# Patient Record
Sex: Female | Born: 1960 | Race: Black or African American | Hispanic: No | Marital: Single | Smoking: Former smoker
Health system: Southern US, Community
[De-identification: ages and names within clinical notes are randomized; demographics above are authoritative.]

## PROBLEM LIST (undated history)

## (undated) DIAGNOSIS — E0590 Thyrotoxicosis, unspecified without thyrotoxic crisis or storm: Secondary | ICD-10-CM

## (undated) HISTORY — PX: ABDOMINAL HYSTERECTOMY: SHX81

## (undated) HISTORY — DX: Thyrotoxicosis, unspecified without thyrotoxic crisis or storm: E05.90

---

## 2015-01-13 LAB — TSH: TSH: 0.16 u[IU]/mL — AB (ref <=5.90)

## 2015-02-16 ENCOUNTER — Encounter: Payer: Self-pay | Admitting: "Endocrinology

## 2015-02-16 ENCOUNTER — Ambulatory Visit (INDEPENDENT_AMBULATORY_CARE_PROVIDER_SITE_OTHER): Payer: 59 | Admitting: "Endocrinology

## 2015-02-16 VITALS — BP 110/77 | HR 104 | Ht 61.0 in | Wt 134.0 lb

## 2015-02-16 DIAGNOSIS — E0590 Thyrotoxicosis, unspecified without thyrotoxic crisis or storm: Secondary | ICD-10-CM

## 2015-02-16 MED ORDER — PROPRANOLOL HCL 20 MG PO TABS
20.0000 mg | ORAL_TABLET | Freq: Three times a day (TID) | ORAL | Status: DC
Start: 2015-02-16 — End: 2015-02-24

## 2015-02-16 NOTE — Progress Notes (Signed)
Subjective:    Patient ID: Lorraine Gordon, female    DOB: 1960/10/04, PCP [facility].   History reviewed. No pertinent past medical history. Past Surgical History  Procedure Laterality Date  . Abdominal hysterectomy     Social History   Social History  . Marital Status: Single    Spouse Name: N/A  . Number of Children: N/A  . Years of Education: N/A   Social History Main Topics  . Smoking status: Former Games developer  . Smokeless tobacco: None  . Alcohol Use: No  . Drug Use: No  . Sexual Activity: Not Asked   Other Topics Concern  . None   Social History Narrative  . None   Outpatient Encounter Prescriptions as of 02/16/2015  Medication Sig  . Calcium Citrate-Vitamin D (CITRACAL MAXIMUM PO) Take by mouth daily.  . cyclobenzaprine (FLEXERIL) 10 MG tablet Take 10 mg by mouth 3 (three) times daily as needed for muscle spasms.  Marland Kitchen ibuprofen (ADVIL,MOTRIN) 800 MG tablet Take 800 mg by mouth every 8 (eight) hours as needed.  . Multiple Vitamins-Minerals (CENTRUM SILVER PO) Take by mouth daily.  . propranolol (INDERAL) 20 MG tablet Take 1 tablet (20 mg total) by mouth 3 (three) times daily.  . Pseudoephedrine-Ibuprofen (ADVIL COLD/SINUS PO) Take by mouth daily as needed.  . sodium chloride (OCEAN) 0.65 % SOLN nasal spray Place 1 spray into both nostrils.   No facility-administered encounter medications on file as of 02/16/2015.   ALLERGIES: Allergies  Allergen Reactions  . Compazine [Prochlorperazine Edisylate]   . Phenergan [Promethazine Hcl]    VACCINATION STATUS:  There is no immunization history on file for this patient.  HPI  The patient presents today with a medical history as above, and is being seen in consultation for hyperthyroidism requested by her primary care provider at [facility] .   The patient has been dealing with symptoms of  weight loss of 15 pounds in a year, sleep disturbance, and safety for Katrinka Blazing a  six-month. These symptoms are progressively worsening and troubling to patient.   The patient does not know family history of thyroid dysfunction  since she is adopted. 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  Patient is not on any anti-thyroid medications nor on any thyroid hormone supplements. Patient  is willing to proceed with appropriate work up and therapy for thyrotoxicosis.   Review of Systems Constitutional: +weight loss, +fatigue, no subjective hyperthermia/hypothermia Eyes: no blurry vision, no xerophthalmia ENT: no sore throat, no nodules palpated in throat, no dysphagia/odynophagia, no hoarseness Cardiovascular: no CP/SOB/palpitations/leg swelling Respiratory: no cough/SOB Gastrointestinal: no N/V/D/C Musculoskeletal: no muscle/joint aches Skin: no rashes Neurological: +tremors/numbness of outstretched hands. Psych : no depression/anxiety  Objective:    BP 110/77 mmHg  Pulse 104  Ht  (1.549 m)  Wt 134 lb (60.782 kg)  BMI 25.33 kg/m2  SpO2 98%  Wt Readings from Last 3 Encounters:  02/16/15 134 lb (60.782 kg)    Physical Exam Constitutional: overweight, in NAD Eyes: PERRLA, EOMI, no exophthalmos ENT: moist mucous membranes, palpable thyroid with no gross enlargement, no cervical lymphadenopathy Cardiovascular: Tachycardic, No MRG Respiratory: CTA B Gastrointestinal: abdomen soft, NT, ND, BS+ Musculoskeletal: no deformities, strength intact in all 4 Skin: moist, warm, no rashes Neurological: + tremor with outstretched hands, DTR normal in all 4  On December 15,016 TSH 0.16, free T3 3 .0, T3 uptake 23, total T4 10.6, free  T4 index 2.4   Assessment & Plan:   1. Subclinical hyperthyroidism Patient's history and most recent labs are reviewed. Findings are consistent with thyrotoxicosis likely from hyperthyroidism. The potential risks of untreated thyrotoxicosis and the need for definitive therapy have been  discussed in detail with the patient, and the patient agrees to proceed with plan.   I like to repeat full profile thyroid function tests today , if suggestive she will have  confirmatory thyroid uptake and scan.   Therapy may involve RAI ablation of the thyroid, with subsequent need for lifelong thyroid hormone replacement. Pt is made aware of this fact and willing to proceed. The patient will return in 1 week for reevaluation. I will initiate low dose Propranolol  20 mg by mouth twice a day for symptomatic relief.  - 40 minutes of time was spent on the care of this patient , 50% of which was applied for counseling on complications of thyrotoxicosis, options ,  and the need for definitive therapy to prevent these complications.  - I advised patient to maintain close follow up with [facility] for primary care needs.  Follow up plan: Return in about 1 week (around 02/23/2015) for overactive thyroid, labs today.  Marquis Lunch, MD Phone: [phone]  Fax: [phone]   02/16/2015, 8:01 PM

## 2015-02-17 LAB — TSH: TSH: 0.307 u[IU]/mL — ABNORMAL LOW (ref 0.350–4.500)

## 2015-02-17 LAB — THYROID PEROXIDASE ANTIBODY: Thyroperoxidase Ab SerPl-aCnc: 2 IU/mL (ref <9)

## 2015-02-17 LAB — T4, FREE: FREE T4: 1.05 ng/dL (ref 0.80–1.80)

## 2015-02-17 LAB — THYROGLOBULIN ANTIBODY: Thyroglobulin Ab: <1 IU/mL (ref <2)

## 2015-02-24 ENCOUNTER — Ambulatory Visit (INDEPENDENT_AMBULATORY_CARE_PROVIDER_SITE_OTHER): Payer: 59 | Admitting: "Endocrinology

## 2015-02-24 ENCOUNTER — Encounter: Payer: Self-pay | Admitting: "Endocrinology

## 2015-02-24 VITALS — BP 99/67 | HR 78 | Ht 61.0 in | Wt 135.0 lb

## 2015-02-24 DIAGNOSIS — E0590 Thyrotoxicosis, unspecified without thyrotoxic crisis or storm: Secondary | ICD-10-CM

## 2015-02-24 MED ORDER — PROPRANOLOL HCL 20 MG PO TABS: 20.0000 mg | ORAL_TABLET | Freq: Two times a day (BID) | ORAL | Status: DC

## 2015-02-24 MED ORDER — METHIMAZOLE 5 MG PO TABS: 5.0000 mg | ORAL_TABLET | Freq: Every day | ORAL | Status: DC

## 2015-02-24 NOTE — Progress Notes (Signed)
Subjective:    Patient ID: Lorraine Gordon, female    DOB: Dec 29, 1960, PCP [facility].   History reviewed. No pertinent past medical history. Past Surgical History  Procedure Laterality Date  . Abdominal hysterectomy     Social History   Social History  . Marital Status: Single    Spouse Name: N/A  . Number of Children: N/A  . Years of Education: N/A   Social History Main Topics  . Smoking status: Former Games developer  . Smokeless tobacco: None  . Alcohol Use: No  . Drug Use: No  . Sexual Activity: Not Asked   Other Topics Concern  . None   Social History Narrative   Outpatient Encounter Prescriptions as of 02/24/2015  Medication Sig  . Calcium Citrate-Vitamin D (CITRACAL MAXIMUM PO) Take by mouth daily.  . cyclobenzaprine (FLEXERIL) 10 MG tablet Take 10 mg by mouth 3 (three) times daily as needed for muscle spasms.  Marland Kitchen ibuprofen (ADVIL,MOTRIN) 800 MG tablet Take 800 mg by mouth every 8 (eight) hours as needed.  . Multiple Vitamins-Minerals (CENTRUM SILVER PO) Take by mouth daily.  . propranolol (INDERAL) 20 MG tablet Take 1 tablet (20 mg total) by mouth 2 (two) times daily.  . Pseudoephedrine-Ibuprofen (ADVIL COLD/SINUS PO) Take by mouth daily as needed.  . sodium chloride (OCEAN) 0.65 % SOLN nasal spray Place 1 spray into both nostrils.  . [DISCONTINUED] propranolol (INDERAL) 20 MG tablet Take 1 tablet (20 mg total) by mouth 3 (three) times daily.  . methimazole (TAPAZOLE) 5 MG tablet Take 1 tablet (5 mg total) by mouth daily.   No facility-administered encounter medications on file as of 02/24/2015.   ALLERGIES: Allergies  Allergen Reactions  . Compazine [Prochlorperazine Edisylate]   . Phenergan [Promethazine Hcl]    VACCINATION STATUS:  There is no immunization history on file for this patient.  HPI  The patient presents today with a medical history as above. She is here for follow-up after repeat thyroid function tests for  hyperthyroidism.    The patient has been dealing with symptoms of  weight loss of 15 pounds in a year, sleep disturbance, and safety for Katrinka Blazing a six-month. These symptoms are progressively worsening and troubling to patient.  -She was started on low-dose propranolol which helped with symptoms. The patient does not know family history of thyroid dysfunction  since she is adopted. 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  Patient is not on any anti-thyroid medications nor on any thyroid hormone supplements.   Review of Systems Constitutional: +weight loss, +fatigue, no subjective hyperthermia/hypothermia Eyes: no blurry vision, no xerophthalmia ENT: no sore throat, no nodules palpated in throat, no dysphagia/odynophagia, no hoarseness Cardiovascular: no CP/SOB/palpitations/leg swelling Respiratory: no cough/SOB Gastrointestinal: no N/V/D/C Musculoskeletal: no muscle/joint aches Skin: no rashes Neurological: +tremors/numbness of outstretched hands. Psych : no depression/anxiety  Objective:    BP 99/67 mmHg  Pulse 78  Ht  (1.549 m)  Wt 135 lb (61.236 kg)  BMI 25.52 kg/m2  SpO2 99%  Wt Readings from Last 3 Encounters:  02/24/15 135 lb (61.236 kg)  02/16/15 134 lb (60.782 kg)    Physical Exam Constitutional: overweight, in NAD Eyes: PERRLA, EOMI, no exophthalmos ENT: moist mucous membranes, palpable thyroid with no gross enlargement, no cervical lymphadenopathy Cardiovascular: Tachycardia resolved, No MRG Respiratory: CTA B Gastrointestinal: abdomen soft, NT, ND, BS+ Musculoskeletal: no deformities, strength intact in all 4 Skin: moist, warm, no rashes  Neurological: + tremor with outstretched hands, DTR normal in all 4  Recent Results (from the past 2160 hour(s))  TSH     Status: Abnormal   Collection Time: 01/13/15 12:00 AM  Result Value Ref Range   TSH 0.16 (A) .41 - 5.90 uIU/mL  Thyroid peroxidase antibody     Status:  None   Collection Time: 02/16/15  3:06 PM  Result Value Ref Range   Thyroperoxidase Ab SerPl-aCnc 2 <9 IU/mL  Thyroglobulin antibody     Status: None   Collection Time: 02/16/15  3:06 PM  Result Value Ref Range   Thyroglobulin Ab <1 <2 IU/mL  TSH     Status: Abnormal   Collection Time: 02/16/15  3:06 PM  Result Value Ref Range   TSH 0.307 (L) 0.350 - 4.500 uIU/mL  T4, free     Status: None   Collection Time: 02/16/15  3:06 PM  Result Value Ref Range   Free T4 1.05 0.80 - 1.80 ng/dL    Assessment & Plan:   1. Subclinical hyperthyroidism Her repeat labs show mild suppression of TSH to 0.3 associated with some nonspecific clinical symptoms. Given the fact that she has benefited from low-dose beta blocker, I will continue on propranolol 20 mg by mouth twice a day. I will also add methimazole 5 mg by mouth daily. She would not need radioactive iodine thyroid ablation for now. -I will repeat thyroid function tests in 8 weeks with office visit. - - I advised patient to maintain close follow up with [facility] for primary care needs.  Follow up plan: Return in about 8 weeks (around 04/21/2015) for overactive thyroid.  Marquis Lunch, MD Phone: [phone]  Fax: [phone]   02/24/2015, 3:27 PM

## 2015-04-12 ENCOUNTER — Other Ambulatory Visit: Payer: Self-pay | Admitting: "Endocrinology

## 2015-04-12 LAB — TSH: TSH: 0.73 m[IU]/L

## 2015-04-12 LAB — T4, FREE: FREE T4: 0.9 ng/dL (ref 0.8–1.8)

## 2015-04-15 ENCOUNTER — Other Ambulatory Visit: Payer: Self-pay | Admitting: "Endocrinology

## 2015-04-25 ENCOUNTER — Ambulatory Visit (INDEPENDENT_AMBULATORY_CARE_PROVIDER_SITE_OTHER): Payer: 59 | Admitting: "Endocrinology

## 2015-04-25 ENCOUNTER — Encounter: Payer: Self-pay | Admitting: "Endocrinology

## 2015-04-25 VITALS — BP 106/74 | HR 70 | Resp 18 | Ht 61.0 in | Wt 137.0 lb

## 2015-04-25 DIAGNOSIS — E0590 Thyrotoxicosis, unspecified without thyrotoxic crisis or storm: Secondary | ICD-10-CM | POA: Diagnosis not present

## 2015-04-25 MED ORDER — METHIMAZOLE 5 MG PO TABS: 2.5000 mg | ORAL_TABLET | Freq: Every day | ORAL | Status: DC

## 2015-04-25 MED ORDER — PROPRANOLOL HCL 20 MG PO TABS: 20.0000 mg | ORAL_TABLET | Freq: Two times a day (BID) | ORAL | Status: DC

## 2015-04-25 MED ORDER — PROPRANOLOL HCL 20 MG PO TABS: 20.0000 mg | ORAL_TABLET | Freq: Every day | ORAL | Status: DC

## 2015-04-25 NOTE — Progress Notes (Signed)
Subjective:    Patient ID: Lorraine Gordon, female    DOB: 11/07/1960, PCP [facility].   No past medical history on file. Past Surgical History  Procedure Laterality Date  . Abdominal hysterectomy     Social History   Social History  . Marital Status: Single    Spouse Name: N/A  . Number of Children: N/A  . Years of Education: N/A   Social History Main Topics  . Smoking status: Former Games developer  . Smokeless tobacco: None  . Alcohol Use: No  . Drug Use: No  . Sexual Activity: Not Asked   Other Topics Concern  . None   Social History Narrative   Outpatient Encounter Prescriptions as of 04/25/2015  Medication Sig  . Calcium Citrate-Vitamin D (CITRACAL MAXIMUM PO) Take by mouth daily.  . cyclobenzaprine (FLEXERIL) 10 MG tablet Take 10 mg by mouth 3 (three) times daily as needed for muscle spasms.  Marland Kitchen ibuprofen (ADVIL,MOTRIN) 800 MG tablet Take 800 mg by mouth every 8 (eight) hours as needed.  . methimazole (TAPAZOLE) 5 MG tablet Take 0.5 tablets (2.5 mg total) by mouth daily.  . Multiple Vitamins-Minerals (CENTRUM SILVER PO) Take by mouth daily.  . propranolol (INDERAL) 20 MG tablet Take 1 tablet (20 mg total) by mouth daily with breakfast.  . Pseudoephedrine-Ibuprofen (ADVIL COLD/SINUS PO) Take by mouth daily as needed.  . sodium chloride (OCEAN) 0.65 % SOLN nasal spray Place 1 spray into both nostrils.  . [DISCONTINUED] methimazole (TAPAZOLE) 5 MG tablet TAKE 1 TABLET BY MOUTH ONCE DAILY.  . [DISCONTINUED] methimazole (TAPAZOLE) 5 MG tablet Take 2.5 mg by mouth daily.  . [DISCONTINUED] propranolol (INDERAL) 20 MG tablet Take 1 tablet (20 mg total) by mouth 2 (two) times daily.  . [DISCONTINUED] propranolol (INDERAL) 20 MG tablet Take 20 mg by mouth daily with breakfast.  . [DISCONTINUED] propranolol (INDERAL) 20 MG tablet Take 1 tablet (20 mg total) by mouth 2 (two) times daily.   No facility-administered encounter medications on file as of  04/25/2015.   ALLERGIES: Allergies  Allergen Reactions  . Compazine [Prochlorperazine Edisylate]   . Phenergan [Promethazine Hcl]    VACCINATION STATUS:  There is no immunization history on file for this patient.  HPI -She is being treated for mild hyperthyroidism with Tapazole. Since last visit she continued to feel better. She has regained a few of her pounds.  -She denies new complaints. She has noticed improvement in her sleep , energy, and mental function.    -She was started on low-dose Tapazole and propranolol which helped with symptoms. The patient does not know family history of thyroid dysfunction  since she is adopted. 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    Review of Systems Constitutional: +weight gain, -fatigue, no subjective hyperthermia/hypothermia Eyes: no blurry vision, no xerophthalmia ENT: no sore throat, no nodules palpated in throat, no dysphagia/odynophagia, no hoarseness Cardiovascular: no CP/SOB/palpitations/leg swelling Respiratory: no cough/SOB Gastrointestinal: no N/V/D/C Musculoskeletal: no muscle/joint aches Skin: no rashes Neurological: tremors/numbness of outstretched hands. Psych : no depression/anxiety  Objective:    BP 106/74 mmHg  Pulse 70  Resp 18  Ht  (1.549 m)  Wt 137 lb (62.143 kg)  BMI 25.90 kg/m2  SpO2 98%  Wt Readings from Last 3 Encounters:  04/25/15 137 lb (62.143 kg)  02/24/15 135 lb (61.236 kg)  02/16/15 134 lb (60.782 kg)    Physical Exam Constitutional: overweight, in NAD  Eyes: PERRLA, EOMI, no exophthalmos ENT: moist mucous membranes, palpable thyroid with no gross enlargement, no cervical lymphadenopathy Cardiovascular: Tachycardia resolved, No MRG Respiratory: CTA B Gastrointestinal: abdomen soft, NT, ND, BS+ Musculoskeletal: no deformities, strength intact in all 4 Skin: moist, warm, no rashes Neurological: - no tremor with outstretched hands, DTR normal  in all 4  Recent Results (from the past 2160 hour(s))  Thyroid peroxidase antibody     Status: None   Collection Time: 02/16/15  3:06 PM  Result Value Ref Range   Thyroperoxidase Ab SerPl-aCnc 2 <9 IU/mL  Thyroglobulin antibody     Status: None   Collection Time: 02/16/15  3:06 PM  Result Value Ref Range   Thyroglobulin Ab <1 <2 IU/mL  TSH     Status: Abnormal   Collection Time: 02/16/15  3:06 PM  Result Value Ref Range   TSH 0.307 (L) 0.350 - 4.500 uIU/mL  T4, free     Status: None   Collection Time: 02/16/15  3:06 PM  Result Value Ref Range   Free T4 1.05 0.80 - 1.80 ng/dL  TSH     Status: None   Collection Time: 04/12/15  7:39 AM  Result Value Ref Range   TSH 0.73 mIU/L    Comment:   Reference Range   > or = 20 Years  0.40-4.50   Pregnancy Range First trimester  0.26-2.66 Second trimester 0.55-2.73 Third trimester  0.43-2.91     T4, free     Status: None   Collection Time: 04/12/15  7:39 AM  Result Value Ref Range   Free T4 0.9 0.8 - 1.8 ng/dL    Assessment & Plan:   1. Subclinical hyperthyroidism Her repeat labs show Treatment response. I advised her to lower the dose of Tapazole to 2.5 mg by mouth daily and propranolol to 20 mg only one time a day.   She will not need radioactive iodine thyroid ablation for now. -I will repeat thyroid function tests in 12 weeks with office visit.  - I advised patient to [facility].  Follow up plan: Return in about 3 months (around 07/26/2015) for overactive thyroid, follow up with pre-visit labs.  Marquis LunchGebre Avenir Lozinski, MD Phone: [phone]  Fax: [phone]   04/25/2015, 3:27 PM

## 2015-06-01 ENCOUNTER — Telehealth: Payer: Self-pay

## 2015-06-01 NOTE — Telephone Encounter (Signed)
Pt states that she had a BP reading of 90/55. She was concerned about taking the Propanolol and does the dosage need to be adjusted. She also Checked her BP the following day and it was 123/76. I advised her to keep track of if and to let us know what her BP readings are when Dr Fransico HimNida returns on 06/07/15. In the meantime I advised her to contact her PCP if she has any problem before then,.

## 2015-06-15 NOTE — Telephone Encounter (Signed)
Called pt to follow up. Left voice mail for her to call back w/ any concerns.

## 2015-07-27 ENCOUNTER — Ambulatory Visit: Payer: 59 | Admitting: "Endocrinology

## 2015-08-15 ENCOUNTER — Other Ambulatory Visit: Payer: Self-pay | Admitting: "Endocrinology

## 2015-08-16 ENCOUNTER — Other Ambulatory Visit: Payer: Self-pay | Admitting: "Endocrinology

## 2015-08-16 LAB — T4, FREE: Free T4: 0.8 ng/dL (ref 0.8–1.8)

## 2015-08-16 LAB — TSH: TSH: 0.76 mIU/L

## 2015-09-26 ENCOUNTER — Encounter: Payer: Self-pay | Admitting: "Endocrinology

## 2015-09-26 ENCOUNTER — Ambulatory Visit (INDEPENDENT_AMBULATORY_CARE_PROVIDER_SITE_OTHER): Payer: 59 | Admitting: "Endocrinology

## 2015-09-26 VITALS — BP 114/74 | HR 76 | Ht 61.0 in | Wt 135.0 lb

## 2015-09-26 DIAGNOSIS — E0590 Thyrotoxicosis, unspecified without thyrotoxic crisis or storm: Secondary | ICD-10-CM | POA: Diagnosis not present

## 2015-09-26 NOTE — Progress Notes (Signed)
Subjective:    Patient ID: Lorraine Gordon, female    DOB: 01-29-1961, PCP [facility].   History reviewed. No pertinent past medical history. Past Surgical History:  Procedure Laterality Date  . ABDOMINAL HYSTERECTOMY     Social History   Social History  . Marital status: Single    Spouse name: N/A  . Number of children: N/A  . Years of education: N/A   Social History Main Topics  . Smoking status: Former Games developer  . Smokeless tobacco: Never Used  . Alcohol use No  . Drug use: No  . Sexual activity: Not Asked   Other Topics Concern  . None   Social History Narrative  . None   Outpatient Encounter Prescriptions as of 09/26/2015  Medication Sig  . ferrous sulfate 325 (65 FE) MG EC tablet Take 325 mg by mouth 3 (three) times daily with meals.  . Calcium Citrate-Vitamin D (CITRACAL MAXIMUM PO) Take by mouth daily.  . cyclobenzaprine (FLEXERIL) 10 MG tablet Take 10 mg by mouth 3 (three) times daily as needed for muscle spasms.  Marland Kitchen ibuprofen (ADVIL,MOTRIN) 800 MG tablet Take 800 mg by mouth every 8 (eight) hours as needed.  . Multiple Vitamins-Minerals (CENTRUM SILVER PO) Take by mouth daily.  . Pseudoephedrine-Ibuprofen (ADVIL COLD/SINUS PO) Take by mouth daily as needed.  . sodium chloride (OCEAN) 0.65 % SOLN nasal spray Place 1 spray into both nostrils.  . [DISCONTINUED] methimazole (TAPAZOLE) 5 MG tablet TAKE 1/2 TABLET BY MOUTH ONCE DAILY  . [DISCONTINUED] propranolol (INDERAL) 20 MG tablet Take 1 tablet (20 mg total) by mouth daily with breakfast. (Patient taking differently: Take 10 mg by mouth daily with breakfast. )   No facility-administered encounter medications on file as of 09/26/2015.    ALLERGIES: Allergies  Allergen Reactions  . Compazine [Prochlorperazine Edisylate]   . Phenergan [Promethazine Hcl]    VACCINATION STATUS:  There is no immunization history on file for this patient.  HPI -She is being treated for mild  hyperthyroidism with Tapazole. Since last visit she continued to feel better. She has Steady body weight this time.  -She denies new complaints. She has noticed improvement in her sleep , energy, and mental function.    -She was started on low-dose Tapazole and propranolol which helped with symptoms. The patient does not know family history of thyroid dysfunction  since she is adopted. 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    Review of Systems Constitutional: - weight gain, -fatigue, no subjective hyperthermia/hypothermia Eyes: no blurry vision, no xerophthalmia ENT: no sore throat, no nodules palpated in throat, no dysphagia/odynophagia, no hoarseness Cardiovascular: no CP/SOB/palpitations/leg swelling Respiratory: no cough/SOB Gastrointestinal: no N/V/D/C Musculoskeletal: no muscle/joint aches Skin: no rashes Neurological: tremors/numbness of outstretched hands. Psych : no depression/anxiety  Objective:    BP 114/74   Pulse 76   Ht 5\' 1"  (1.549 m)   Wt 135 lb (61.2 kg)   BMI 25.51 kg/m   Wt Readings from Last 3 Encounters:  09/26/15 135 lb (61.2 kg)  04/25/15 137 lb (62.1 kg)  02/24/15 135 lb (61.2 kg)    Physical Exam Constitutional: overweight, in NAD Eyes: PERRLA, EOMI, no exophthalmos ENT: moist mucous membranes, palpable thyroid with no gross enlargement, no cervical lymphadenopathy Cardiovascular: Tachycardia resolved, No MRG Respiratory: CTA B Gastrointestinal: abdomen soft, NT, ND, BS+ Musculoskeletal: no deformities, strength intact in all 4 Skin: moist, warm, no rashes Neurological: - no  tremor with outstretched hands, DTR normal in all 4  Recent Results (from the past 2160 hour(s))  TSH     Status: None   Collection Time: 08/15/15  2:05 PM  Result Value Ref Range   TSH 0.76 mIU/L    Comment:   Reference Range   > or = 20 Years  0.40-4.50   Pregnancy Range First trimester  0.26-2.66 Second trimester  0.55-2.73 Third trimester  0.43-2.91     T4, free     Status: None   Collection Time: 08/15/15  2:05 PM  Result Value Ref Range   Free T4 0.8 0.8 - 1.8 ng/dL    Assessment & Plan:   1. Subclinical hyperthyroidism - She has responded to low-dose methimazole and propranolol. At this time her thyroid is adequately treated. I will discontinue both methimazole and propranolol and repeat thyroid function test in 4 months with office visit.  - I advised patient to maintain close follow [facility].  Follow up plan: Return in about 4 months (around 01/26/2016) for follow up with pre-visit labs.  Marquis LunchGebre Khrystian Schauf, MD Phone: [phone]  Fax: [phone]   09/26/2015, 2:24 PM

## 2015-09-27 ENCOUNTER — Encounter (INDEPENDENT_AMBULATORY_CARE_PROVIDER_SITE_OTHER): Payer: Self-pay | Admitting: *Deleted

## 2016-01-26 ENCOUNTER — Ambulatory Visit: Payer: 59 | Admitting: "Endocrinology

## 2016-02-20 ENCOUNTER — Ambulatory Visit: Payer: 59 | Admitting: "Endocrinology

## 2016-03-09 ENCOUNTER — Other Ambulatory Visit: Payer: Self-pay | Admitting: "Endocrinology

## 2016-03-09 LAB — TSH: TSH: 0.17 mIU/L — ABNORMAL LOW

## 2016-03-09 LAB — T4, FREE: FREE T4: 0.9 ng/dL (ref 0.8–1.8)

## 2016-03-19 ENCOUNTER — Encounter: Payer: Self-pay | Admitting: "Endocrinology

## 2016-03-19 ENCOUNTER — Ambulatory Visit (INDEPENDENT_AMBULATORY_CARE_PROVIDER_SITE_OTHER): Payer: PRIVATE HEALTH INSURANCE | Admitting: "Endocrinology

## 2016-03-19 VITALS — BP 103/65 | HR 80 | Ht 61.0 in | Wt 133.0 lb

## 2016-03-19 DIAGNOSIS — E0590 Thyrotoxicosis, unspecified without thyrotoxic crisis or storm: Secondary | ICD-10-CM | POA: Diagnosis not present

## 2016-03-19 NOTE — Progress Notes (Signed)
Subjective:    Patient ID: Lorraine Gordon, female    DOB: 1960/04/08, PCP WEEKS,CYNTHIA, MD.   History reviewed. No pertinent past medical history. Past Surgical History:  Procedure Laterality Date  . ABDOMINAL HYSTERECTOMY     Social History   Social History  . Marital status: Single    Spouse name: N/A  . Number of children: N/A  . Years of education: N/A   Social History Main Topics  . Smoking status: Former Games developer  . Smokeless tobacco: Never Used  . Alcohol use No  . Drug use: No  . Sexual activity: Not Asked   Other Topics Concern  . None   Social History Narrative  . None   Outpatient Encounter Prescriptions as of 03/19/2016  Medication Sig  . Calcium Citrate-Vitamin D (CITRACAL MAXIMUM PO) Take by mouth daily.  . cyclobenzaprine (FLEXERIL) 10 MG tablet Take 10 mg by mouth 3 (three) times daily as needed for muscle spasms.  . ferrous sulfate 325 (65 FE) MG EC tablet Take 325 mg by mouth 3 (three) times daily with meals.  Marland Kitchen ibuprofen (ADVIL,MOTRIN) 800 MG tablet Take 800 mg by mouth every 8 (eight) hours as needed.  . Multiple Vitamins-Minerals (CENTRUM SILVER PO) Take by mouth daily.  . Pseudoephedrine-Ibuprofen (ADVIL COLD/SINUS PO) Take by mouth daily as needed.  . sodium chloride (OCEAN) 0.65 % SOLN nasal spray Place 1 spray into both nostrils.   No facility-administered encounter medications on file as of 03/19/2016.    ALLERGIES: Allergies  Allergen Reactions  . Compazine [Prochlorperazine Edisylate]   . Phenergan [Promethazine Hcl]    VACCINATION STATUS:  There is no immunization history on file for this patient.  HPI -She is being Followed up for subclinical hyperthyroidism. She was treated in the past with low-dose methimazole. Since last visit she continued to feel better. She has Steady body weight this time.  -She denies new complaints. She has noticed improvement in her sleep , energy, and mental function.   The patient does not know  family history of thyroid dysfunction  since she is adopted. 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    Review of Systems Constitutional: - weight gain, -fatigue, no subjective hyperthermia/hypothermia Eyes: no blurry vision, no xerophthalmia ENT: no sore throat, no nodules palpated in throat, no dysphagia/odynophagia, no hoarseness Cardiovascular: no CP/SOB/palpitations/leg swelling Respiratory: no cough/SOB Gastrointestinal: no N/V/D/C Musculoskeletal: no muscle/joint aches Skin: no rashes Neurological: tremors/numbness of outstretched hands. Psych : no depression/anxiety  Objective:    BP 103/65   Pulse 80   Ht 5\' 1"  (1.549 m)   Wt 133 lb (60.3 kg)   BMI 25.13 kg/m   Wt Readings from Last 3 Encounters:  03/19/16 133 lb (60.3 kg)  09/26/15 135 lb (61.2 kg)  04/25/15 137 lb (62.1 kg)    Physical Exam Constitutional: overweight, in NAD Eyes: PERRLA, EOMI, no exophthalmos ENT: moist mucous membranes, palpable thyroid with no gross enlargement, no cervical lymphadenopathy Cardiovascular: Tachycardia resolved, No MRG Respiratory: CTA B Gastrointestinal: abdomen soft, NT, ND, BS+ Musculoskeletal: no deformities, strength intact in all 4 Skin: moist, warm, no rashes Neurological: - no tremor with outstretched hands, DTR normal in all 4  Recent Results (from the past 2160 hour(s))  TSH     Status: Abnormal   Collection Time: 03/09/16  1:29 PM  Result Value Ref Range   TSH 0.17 (L) mIU/L    Comment:   Reference Range   >  or = 20 Years  0.40-4.50   Pregnancy Range First trimester  0.26-2.66 Second trimester 0.55-2.73 Third trimester  0.43-2.91     T4, free     Status: None   Collection Time: 03/09/16  1:29 PM  Result Value Ref Range   Free T4 0.9 0.8 - 1.8 ng/dL    Assessment & Plan:   1. Subclinical hyperthyroidism - Her repeat thyroid function tests are consistent with subclinical hyperthyroidism. She will not  need intervention at this time. She will need repeat thyroid function test in 3 months with office visit. - I advised patient to maintain close follow up with WEEKS,CYNTHIA, MD for primary care needs.  Follow up plan: Return in about 3 months (around 06/16/2016) for follow up with pre-visit labs.  Marquis LunchGebre Derrich Gaby, MD Phone: [phone]  Fax: [phone]   03/19/2016, 11:05 AM

## 2016-06-19 ENCOUNTER — Ambulatory Visit: Payer: PRIVATE HEALTH INSURANCE | Admitting: "Endocrinology

## 2017-09-10 LAB — BASIC METABOLIC PANEL
BUN: 11 (ref 4–21)
Creatinine: 0.7 (ref 0.5–1.1)

## 2017-09-10 LAB — LIPID PANEL
CHOLESTEROL: 213 — AB (ref 0–200)
HDL: 55 (ref 35–70)
LDL Cholesterol: 145
TRIGLYCERIDES: 63 (ref 40–160)

## 2017-09-10 LAB — TSH: TSH: 0.22 — AB (ref 0.41–5.90)

## 2017-10-16 ENCOUNTER — Ambulatory Visit (INDEPENDENT_AMBULATORY_CARE_PROVIDER_SITE_OTHER): Payer: 59 | Admitting: "Endocrinology

## 2017-10-16 ENCOUNTER — Encounter: Payer: Self-pay | Admitting: "Endocrinology

## 2017-10-16 VITALS — BP 117/73 | HR 73 | Ht 61.0 in | Wt 127.0 lb

## 2017-10-16 DIAGNOSIS — E0590 Thyrotoxicosis, unspecified without thyrotoxic crisis or storm: Secondary | ICD-10-CM | POA: Diagnosis not present

## 2017-10-16 MED ORDER — METHIMAZOLE 5 MG PO TABS: 5.0000 mg | ORAL_TABLET | Freq: Every day | ORAL | 3 refills | Status: DC

## 2017-10-16 NOTE — Progress Notes (Signed)
Endocrinology follow-up note    Subjective:    Patient ID: Lorraine Gordon, female    DOB: Jan 02, 1961, PCP [name], MD.   History reviewed. No pertinent past medical history. Past Surgical History:  Procedure Laterality Date  . ABDOMINAL HYSTERECTOMY     Social History   Socioeconomic History  . Marital status: Single    Spouse name: Not on file  . Number of children: Not on file  . Years of education: Not on file  . Highest education level: Not on file  Occupational History  . Not on file  Social Needs  . Financial resource strain: Not on file  . Food insecurity:    Worry: Not on file    Inability: Not on file  . Transportation needs:    Medical: Not on file    Non-medical: Not on file  Tobacco Use  . Smoking status: Former Games developer  . Smokeless tobacco: Never Used  Substance and Sexual Activity  . Alcohol use: No    Alcohol/week: 0.0 standard drinks  . Drug use: No  . Sexual activity: Not on file  Lifestyle  . Physical activity:    Days per week: Not on file    Minutes per session: Not on file  . Stress: Not on file  Relationships  . Social connections:    Talks on phone: Not on file    Gets together: Not on file    Attends religious service: Not on file    Active member of club or organization: Not on file    Attends meetings of clubs or organizations: Not on file    Relationship status: Not on file  Other Topics Concern  . Not on file  Social History Narrative  . Not on file   Outpatient Encounter Medications as of 10/16/2017  Medication Sig  . Calcium Citrate-Vitamin D (CITRACAL MAXIMUM PO) Take by mouth daily.  . cyclobenzaprine (FLEXERIL) 10 MG tablet Take 10 mg by mouth 3 (three) times daily as needed for muscle spasms.  . ferrous sulfate 325 (65 FE) MG EC tablet Take 325 mg by mouth 3 (three) times daily with meals.  Marland Kitchen ibuprofen (ADVIL,MOTRIN) 800 MG tablet Take 800 mg by mouth every 8 (eight) hours as needed.  . methimazole (TAPAZOLE) 5 MG  tablet Take 1 tablet (5 mg total) by mouth daily.  . Multiple Vitamins-Minerals (CENTRUM SILVER PO) Take by mouth daily.  . Pseudoephedrine-Ibuprofen (ADVIL COLD/SINUS PO) Take by mouth daily as needed.  . sodium chloride (OCEAN) 0.65 % SOLN nasal spray Place 1 spray into both nostrils.   No facility-administered encounter medications on file as of 10/16/2017.    ALLERGIES: Allergies  Allergen Reactions  . Compazine [Prochlorperazine Edisylate]   . Phenergan [Promethazine Hcl]    VACCINATION STATUS:  There is no immunization history on file for this patient.  HPI -She is being Followed up for subclinical hyperthyroidism. She was treated in the past with low-dose methimazole. -She presents with symptoms including on and off palpitations, heat intolerance.  He has lost 6 pounds unintentionally since last visit. The patient does not know family history of thyroid dysfunction  since she is adopted. 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    Review of Systems Constitutional: + weight loss, + fatigue, + subjective hyperthermia Eyes: no blurry vision, no xerophthalmia ENT: no sore throat, no nodules palpated in throat, no dysphagia/odynophagia, no hoarseness Cardiovascular: no chest pain, + palpitations.   Respiratory: no  cough/SOB Gastrointestinal: no N/V/D/C Musculoskeletal: no muscle/joint aches Skin: no rashes Neurological: tremors/numbness of outstretched hands. Psych : no depression/anxiety  Objective:    BP 117/73   Pulse 73   Ht 5\' 1"  (1.549 m)   Wt 127 lb (57.6 kg)   BMI 24.00 kg/m   Wt Readings from Last 3 Encounters:  10/16/17 127 lb (57.6 kg)  03/19/16 133 lb (60.3 kg)  09/26/15 135 lb (61.2 kg)    Physical Exam Constitutional:  + Appropriate weight for height, not in acute distress.   Eyes: PERRLA, EOMI, no exophthalmos ENT: moist mucous membranes, palpable thyroid with no gross enlargement, no cervical  lymphadenopathy Cardiovascular:  No MRG Respiratory: CTA B Gastrointestinal: abdomen soft, NT, ND, BS+ Musculoskeletal: no deformities, strength intact in all 4 Skin: moist, warm, no rashes Neurological: + no tremor with outstretched hands    Labs from September 10, 2017 showed TSH suppressed at 0.223, free T4 0.94  Assessment & Plan:   1. Subclinical hyperthyroidism - Her repeat thyroid function tests are consistent with subclinical hyperthyroidism, however patient has subtle symptoms of thyrotoxicosis.  Will benefit from reinitiation of low-dose Tapazole therapy.  I discussed and initiated Tapazole 5 mg p.o. daily daily with breakfast with plan to repeat thyroid function test in 3 months with office visit.   - I advised patient to maintain close follow up with Thomes DinningWeeks, Cynthia, MD for primary care needs.  Follow up plan: Return in about 3 months (around 01/15/2018) for Follow up with Pre-visit Labs.  Marquis LunchGebre Charlina Dwight, MD Phone: [phone]  Fax: [phone]   10/16/2017, 5:07 PM

## 2018-01-02 LAB — TSH: TSH: 1.16 mIU/L (ref 0.40–4.50)

## 2018-01-02 LAB — T3, FREE: T3, Free: 3.1 pg/mL (ref 2.3–4.2)

## 2018-01-02 LAB — T4, FREE: FREE T4: 0.8 ng/dL (ref 0.8–1.8)

## 2018-01-13 ENCOUNTER — Other Ambulatory Visit: Payer: Self-pay | Admitting: "Endocrinology

## 2018-01-17 ENCOUNTER — Ambulatory Visit: Payer: 59 | Admitting: "Endocrinology

## 2018-01-17 ENCOUNTER — Encounter: Payer: Self-pay | Admitting: "Endocrinology

## 2018-01-17 VITALS — BP 121/82 | HR 82 | Ht 61.0 in | Wt 132.0 lb

## 2018-01-17 DIAGNOSIS — E0590 Thyrotoxicosis, unspecified without thyrotoxic crisis or storm: Secondary | ICD-10-CM

## 2018-01-17 MED ORDER — METHIMAZOLE 5 MG PO TABS: 2.5000 mg | ORAL_TABLET | Freq: Every day | ORAL | 2 refills | Status: DC

## 2018-01-17 NOTE — Progress Notes (Signed)
Endocrinology follow-up note  Subjective:    Patient ID: Lorraine Gordon, female    DOB: 06/11/1960, PCP Thomes DinningWeeks, Cynthia, MD.   History reviewed. No pertinent past medical history. Past Surgical History:  Procedure Laterality Date  . ABDOMINAL HYSTERECTOMY     Social History   Socioeconomic History  . Marital status: Single    Spouse name: Not on file  . Number of children: Not on file  . Years of education: Not on file  . Highest education level: Not on file  Occupational History  . Not on file  Social Needs  . Financial resource strain: Not on file  . Food insecurity:    Worry: Not on file    Inability: Not on file  . Transportation needs:    Medical: Not on file    Non-medical: Not on file  Tobacco Use  . Smoking status: Former Games developermoker  . Smokeless tobacco: Never Used  Substance and Sexual Activity  . Alcohol use: No    Alcohol/week: 0.0 standard drinks  . Drug use: No  . Sexual activity: Not on file  Lifestyle  . Physical activity:    Days per week: Not on file    Minutes per session: Not on file  . Stress: Not on file  Relationships  . Social connections:    Talks on phone: Not on file    Gets together: Not on file    Attends religious service: Not on file    Active member of club or organization: Not on file    Attends meetings of clubs or organizations: Not on file    Relationship status: Not on file  Other Topics Concern  . Not on file  Social History Narrative  . Not on file   Outpatient Encounter Medications as of 01/17/2018  Medication Sig  . Calcium Citrate-Vitamin D (CITRACAL MAXIMUM PO) Take by mouth daily.  . cyclobenzaprine (FLEXERIL) 10 MG tablet Take 10 mg by mouth 3 (three) times daily as needed for muscle spasms.  . ferrous sulfate 325 (65 FE) MG EC tablet Take 325 mg by mouth 3 (three) times daily with meals.  Marland Kitchen. ibuprofen (ADVIL,MOTRIN) 800 MG tablet Take 800 mg by mouth every 8 (eight) hours as needed.  . methimazole (TAPAZOLE) 5 MG  tablet Take 0.5 tablets (2.5 mg total) by mouth daily.  . Multiple Vitamins-Minerals (CENTRUM SILVER PO) Take by mouth daily.  . Pseudoephedrine-Ibuprofen (ADVIL COLD/SINUS PO) Take by mouth daily as needed.  . sodium chloride (OCEAN) 0.65 % SOLN nasal spray Place 1 spray into both nostrils.  . [DISCONTINUED] methimazole (TAPAZOLE) 5 MG tablet TAKE 1 TABLET BY MOUTH ONCE DAILY.   No facility-administered encounter medications on file as of 01/17/2018.    ALLERGIES: Allergies  Allergen Reactions  . Compazine [Prochlorperazine Edisylate]   . Phenergan [Promethazine Hcl]    VACCINATION STATUS:  There is no immunization history on file for this patient.  HPI -She is being Followed up for subclinical hyperthyroidism. She was treated in the past with low-dose methimazole. -She presents with improving in her symptoms of palpitations, heat intolerance.   She has regained 5 of her pounds, after losing 6 pounds unintentionally.  The patient does not know family history of thyroid dysfunction  since she is adopted. 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    Review of Systems Constitutional: + weight gain,  + fatigue, - subjective hyperthermia Eyes: no blurry vision, no xerophthalmia ENT: no sore throat,  no nodules palpated in throat, no dysphagia/odynophagia, no hoarseness Musculoskeletal: no muscle/joint aches Skin: no rashes Neurological: tremors/numbness of outstretched hands. Psych : no depression/anxiety  Objective:    BP 121/82   Pulse 82   Ht 5\' 1"  (1.549 m)   Wt 132 lb (59.9 kg)   BMI 24.94 kg/m   Wt Readings from Last 3 Encounters:  01/17/18 132 lb (59.9 kg)  10/16/17 127 lb (57.6 kg)  03/19/16 133 lb (60.3 kg)    Physical Exam Constitutional:  + Appropriate weight for height, not in acute distress.   Eyes: PERRLA, EOMI, no exophthalmos ENT: moist mucous membranes, palpable thyroid with no gross enlargement, no cervical  lymphadenopathy Gastrointestinal: abdomen soft, NT, ND, BS+ Musculoskeletal: no deformities, strength intact in all 4 Skin: moist, warm, no rashes Neurological: + no tremor with outstretched hands    Labs from September 10, 2017 showed TSH suppressed at 0.223, free T4 0.94 Recent Results (from the past 2160 hour(s))  T3, free     Status: None   Collection Time: 01/01/18  9:28 AM  Result Value Ref Range   T3, Free 3.1 2.3 - 4.2 pg/mL  T4, free     Status: None   Collection Time: 01/01/18  9:28 AM  Result Value Ref Range   Free T4 0.8 0.8 - 1.8 ng/dL  TSH     Status: None   Collection Time: 01/01/18  9:28 AM  Result Value Ref Range   TSH 1.16 0.40 - 4.50 mIU/L     Assessment & Plan:   1. Subclinical hyperthyroidism - Her previsit labs are consistent with treatment response.  She also has clinical improvement of her previous symptoms.   -I advised her to lower her Tapazole to 2.5 mg p.o. daily at breakfast with plan to repeat thyroid function test in 3 months with office visit.   - I advised patient to maintain close follow up with Thomes DinningWeeks, Cynthia, MD for primary care needs.  Follow up plan: Return in about 3 months (around 04/18/2018) for Follow up with Pre-visit Labs.  Marquis LunchGebre Grettel Rames, MD Phone: [phone]  Fax: [phone]   01/17/2018, 9:54 AM

## 2018-04-09 LAB — TSH: TSH: 1.1 m[IU]/L (ref 0.40–4.50)

## 2018-04-09 LAB — T4, FREE: Free T4: 0.8 ng/dL (ref 0.8–1.8)

## 2018-04-22 ENCOUNTER — Telehealth (INDEPENDENT_AMBULATORY_CARE_PROVIDER_SITE_OTHER): Payer: PRIVATE HEALTH INSURANCE | Admitting: "Endocrinology

## 2018-04-22 ENCOUNTER — Other Ambulatory Visit: Payer: Self-pay

## 2018-04-22 DIAGNOSIS — E0590 Thyrotoxicosis, unspecified without thyrotoxic crisis or storm: Secondary | ICD-10-CM | POA: Diagnosis not present

## 2018-04-22 NOTE — Progress Notes (Signed)
Endocrinology Telephone Visit Follow up Note -During COVID -19 Pandemic   Subjective:    Patient ID: Lorraine Gordon, female    DOB: 1960-04-05, PCP [name], MD.   No past medical history on file. Past Surgical History:  Procedure Laterality Date  . ABDOMINAL HYSTERECTOMY     Social History   Socioeconomic History  . Marital status: Single    Spouse name: Not on file  . Number of children: Not on file  . Years of education: Not on file  . Highest education level: Not on file  Occupational History  . Not on file  Social Needs  . Financial resource strain: Not on file  . Food insecurity:    Worry: Not on file    Inability: Not on file  . Transportation needs:    Medical: Not on file    Non-medical: Not on file  Tobacco Use  . Smoking status: Former Games developer  . Smokeless tobacco: Never Used  Substance and Sexual Activity  . Alcohol use: No    Alcohol/week: 0.0 standard drinks  . Drug use: No  . Sexual activity: Not on file  Lifestyle  . Physical activity:    Days per week: Not on file    Minutes per session: Not on file  . Stress: Not on file  Relationships  . Social connections:    Talks on phone: Not on file    Gets together: Not on file    Attends religious service: Not on file    Active member of club or organization: Not on file    Attends meetings of clubs or organizations: Not on file    Relationship status: Not on file  Other Topics Concern  . Not on file  Social History Narrative  . Not on file   Outpatient Encounter Medications as of 04/22/2018  Medication Sig  . Calcium Citrate-Vitamin D (CITRACAL MAXIMUM PO) Take by mouth daily.  . cyclobenzaprine (FLEXERIL) 10 MG tablet Take 10 mg by mouth 3 (three) times daily as needed for muscle spasms.  . ferrous sulfate 325 (65 FE) MG EC tablet Take 325 mg by mouth 3 (three) times daily with meals.  Marland Kitchen ibuprofen (ADVIL,MOTRIN) 800 MG tablet Take 800 mg  by mouth every 8 (eight) hours as needed.  . Multiple Vitamins-Minerals (CENTRUM SILVER PO) Take by mouth daily.  . Pseudoephedrine-Ibuprofen (ADVIL COLD/SINUS PO) Take by mouth daily as needed.  . sodium chloride (OCEAN) 0.65 % SOLN nasal spray Place 1 spray into both nostrils.  . [DISCONTINUED] methimazole (TAPAZOLE) 5 MG tablet Take 0.5 tablets (2.5 mg total) by mouth daily.   No facility-administered encounter medications on file as of 04/22/2018.    ALLERGIES: Allergies  Allergen Reactions  . Compazine [Prochlorperazine Edisylate]   . Phenergan [Promethazine Hcl]    VACCINATION STATUS:  There is no immunization history on file for this patient.  HPI -58 year old female with subclinical hyperthyroidism on low-dose methimazole.  This is a telephone visit due to coronavirus pandemic.  She reports improvement in her symptoms, currently on 2.5 mg of methimazole daily.    She has no new complaints.    The patient does not know family history of thyroid dysfunction  since she is adopted. 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      Objective:        Labs from September 10, 2017 showed TSH suppressed at 0.223, free T4 0.94 Recent Results (from the past 2160 hour(s))  T4, free     Status: None   Collection Time: 04/09/18  8:36 AM  Result Value Ref Range   Free T4 0.8 0.8 - 1.8 ng/dL  TSH     Status: None   Collection Time: 04/09/18  8:36 AM  Result Value Ref Range   TSH 1.10 0.40 - 4.50 mIU/L     Assessment & Plan:   1. Subclinical hyperthyroidism -She has responded to low-dose methimazole with normalization of her thyroid function tests.  At this time, she is advised to discontinue methimazole with plan to repeat thyroid function test and office visit in 4-month.   - I advised patient to maintain close follow up with Weeks, Cynthia, MD for primary care needs.  Follow up plan: -4 months with repeat thyroid function  tests. Gebre Mikeyla Music, MD Phone: [phone]  Fax: [phone]   04/22/2018, 8:38 AM  

## 2018-04-22 NOTE — Patient Instructions (Addendum)
Endocrinology Telephone Visit Follow up Note -During COVID -19 Pandemic   Subjective:    Patient ID: Lorraine Gordon, female    DOB: 1960-04-05, PCP [name], MD.   No past medical history on file. Past Surgical History:  Procedure Laterality Date  . ABDOMINAL HYSTERECTOMY     Social History   Socioeconomic History  . Marital status: Single    Spouse name: Not on file  . Number of children: Not on file  . Years of education: Not on file  . Highest education level: Not on file  Occupational History  . Not on file  Social Needs  . Financial resource strain: Not on file  . Food insecurity:    Worry: Not on file    Inability: Not on file  . Transportation needs:    Medical: Not on file    Non-medical: Not on file  Tobacco Use  . Smoking status: Former Games developer  . Smokeless tobacco: Never Used  Substance and Sexual Activity  . Alcohol use: No    Alcohol/week: 0.0 standard drinks  . Drug use: No  . Sexual activity: Not on file  Lifestyle  . Physical activity:    Days per week: Not on file    Minutes per session: Not on file  . Stress: Not on file  Relationships  . Social connections:    Talks on phone: Not on file    Gets together: Not on file    Attends religious service: Not on file    Active member of club or organization: Not on file    Attends meetings of clubs or organizations: Not on file    Relationship status: Not on file  Other Topics Concern  . Not on file  Social History Narrative  . Not on file   Outpatient Encounter Medications as of 04/22/2018  Medication Sig  . Calcium Citrate-Vitamin D (CITRACAL MAXIMUM PO) Take by mouth daily.  . cyclobenzaprine (FLEXERIL) 10 MG tablet Take 10 mg by mouth 3 (three) times daily as needed for muscle spasms.  . ferrous sulfate 325 (65 FE) MG EC tablet Take 325 mg by mouth 3 (three) times daily with meals.  Marland Kitchen ibuprofen (ADVIL,MOTRIN) 800 MG tablet Take 800 mg  by mouth every 8 (eight) hours as needed.  . Multiple Vitamins-Minerals (CENTRUM SILVER PO) Take by mouth daily.  . Pseudoephedrine-Ibuprofen (ADVIL COLD/SINUS PO) Take by mouth daily as needed.  . sodium chloride (OCEAN) 0.65 % SOLN nasal spray Place 1 spray into both nostrils.  . [DISCONTINUED] methimazole (TAPAZOLE) 5 MG tablet Take 0.5 tablets (2.5 mg total) by mouth daily.   No facility-administered encounter medications on file as of 04/22/2018.    ALLERGIES: Allergies  Allergen Reactions  . Compazine [Prochlorperazine Edisylate]   . Phenergan [Promethazine Hcl]    VACCINATION STATUS:  There is no immunization history on file for this patient.  HPI -58 year old female with subclinical hyperthyroidism on low-dose methimazole.  This is a telephone visit due to coronavirus pandemic.  She reports improvement in her symptoms, currently on 2.5 mg of methimazole daily.    She has no new complaints.    The patient does not know family history of thyroid dysfunction  since she is adopted. 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      Objective:        Labs from September 10, 2017 showed TSH suppressed at 0.223, free T4 0.94 Recent Results (from the past 2160 hour(s))  T4, free     Status: None   Collection Time: 04/09/18  8:36 AM  Result Value Ref Range   Free T4 0.8 0.8 - 1.8 ng/dL  TSH     Status: None   Collection Time: 04/09/18  8:36 AM  Result Value Ref Range   TSH 1.10 0.40 - 4.50 mIU/L     Assessment & Plan:   1. Subclinical hyperthyroidism -She has responded to low-dose methimazole with normalization of her thyroid function tests.  At this time, she is advised to discontinue methimazole with plan to repeat thyroid function test and office visit in 84-month.   - I advised patient to maintain close follow up with [name], MD for primary care needs.  Follow up plan: -4 months with repeat thyroid function  tests. Marquis Lunch, MD Phone: [phone]  Fax: [phone]   04/22/2018, 8:38 AM

## 2018-08-25 ENCOUNTER — Ambulatory Visit: Payer: PRIVATE HEALTH INSURANCE | Admitting: "Endocrinology

## 2018-08-25 LAB — TSH: TSH: 0.02 — AB (ref 0.41–5.90)

## 2018-09-01 ENCOUNTER — Other Ambulatory Visit: Payer: Self-pay

## 2018-09-01 ENCOUNTER — Encounter: Payer: Self-pay | Admitting: "Endocrinology

## 2018-09-01 ENCOUNTER — Ambulatory Visit (INDEPENDENT_AMBULATORY_CARE_PROVIDER_SITE_OTHER): Payer: PRIVATE HEALTH INSURANCE | Admitting: "Endocrinology

## 2018-09-01 DIAGNOSIS — E0590 Thyrotoxicosis, unspecified without thyrotoxic crisis or storm: Secondary | ICD-10-CM

## 2018-09-01 MED ORDER — METHIMAZOLE 5 MG PO TABS: 5.0000 mg | ORAL_TABLET | Freq: Every day | ORAL | 3 refills | Status: DC

## 2018-09-01 NOTE — Progress Notes (Signed)
09/01/2018                                Endocrinology Telehealth Visit Follow up Note -During COVID -19 Pandemic  I connected with Lorraine Gordon on 09/01/2018   by telephone and verified that I am speaking with the correct person using two identifiers. Lorraine Gordon, 01-08-1961. she has verbally consented to this visit. All issues noted in this document were discussed and addressed. The format was not optimal for physical exam.   Subjective:    Patient ID: Lorraine Gordon, female    DOB: 01-08-1961, PCP Thomes DinningWeeks, Cynthia, MD.   No past medical history on file. Past Surgical History:  Procedure Laterality Date  . ABDOMINAL HYSTERECTOMY     Social History   Socioeconomic History  . Marital status: Single    Spouse name: Not on file  . Number of children: Not on file  . Years of education: Not on file  . Highest education level: Not on file  Occupational History  . Not on file  Social Needs  . Financial resource strain: Not on file  . Food insecurity:    Worry: Not on file    Inability: Not on file  . Transportation needs:    Medical: Not on file    Non-medical: Not on file  Tobacco Use  . Smoking status: Former Games developermoker  . Smokeless tobacco: Never Used  Substance and Sexual Activity  . Alcohol use: No    Alcohol/week: 0.0 standard drinks  . Drug use: No  . Sexual activity: Not on file  Lifestyle  . Physical activity:    Days per week: Not on file    Minutes per session: Not on file  . Stress: Not on file  Relationships  . Social connections:    Talks on phone: Not on file    Gets together: Not on file    Attends religious service: Not on file    Active member of club or organization: Not on file    Attends meetings of clubs or organizations: Not on file    Relationship status: Not on file  Other Topics Concern  . Not on file  Social History Narrative  . Not on file   Outpatient Encounter Medications as of 04/22/2018  Medication Sig  . Calcium Citrate-Vitamin D  (CITRACAL MAXIMUM PO) Take by mouth daily.  . cyclobenzaprine (FLEXERIL) 10 MG tablet Take 10 mg by mouth 3 (three) times daily as needed for muscle spasms.  . ferrous sulfate 325 (65 FE) MG EC tablet Take 325 mg by mouth 3 (three) times daily with meals.  Marland Kitchen. ibuprofen (ADVIL,MOTRIN) 800 MG tablet Take 800 mg by mouth every 8 (eight) hours as needed.  . Multiple Vitamins-Minerals (CENTRUM SILVER PO) Take by mouth daily.  . Pseudoephedrine-Ibuprofen (ADVIL COLD/SINUS PO) Take by mouth daily as needed.  . sodium chloride (OCEAN) 0.65 % SOLN nasal spray Place 1 spray into both nostrils.  . [DISCONTINUED] methimazole (TAPAZOLE) 5 MG tablet Take 0.5 tablets (2.5 mg total) by mouth daily.   No facility-administered encounter medications on file as of 04/22/2018.    ALLERGIES: Allergies  Allergen Reactions  . Compazine [Prochlorperazine Edisylate]   . Phenergan [Promethazine Hcl]    VACCINATION STATUS:  There is no immunization history on file for this patient.  HPI - 58 year old female patient with medical history of subclinical hyperthyroidism with prior treatment with low-dose methimazole.  She was  taken off of her low-dose methimazole during her last visit.  In the interim, she developed symptoms including palpitations and lab work on August 25, 2018 showed suppressed TSH of  <.015.   This led to reinitiation of her methimazole at 10 mg p.o. daily.  She has subsequent improvement in her symptoms.  She has no new complaints today. The patient does not know family history of thyroid dysfunction  since she is adopted. 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   Review of systems: Limited as above  Objective:     Recent Results (from the past 2160 hour(s))  TSH     Status: Abnormal   Collection Time: 08/25/18 12:00 AM  Result Value Ref Range   TSH 0.02 (A) 0.41 - 5.90    Assessment & Plan:   1. Subclinical hyperthyroidism -This patient has  previously responded to methimazole treatment for subclinical hyperthyroidism.  She is advised to lower her methimazole to 5 mg p.o. daily with plan to repeat thyroid function test in 8 weeks with office visit.  - I advised patient to maintain close follow up with Joanie Coddington, MD for primary care needs.  Follow up plan:  Time for this visit: 15 minutes. Josselyne Pfeifer  participated in the discussions, expressed understanding, and voiced agreement with the above plans.  All questions were answered to her satisfaction. she is encouraged to contact clinic should she have any questions or concerns prior to her return visit.  Glade Lloyd, MD Phone: [phone]  Fax: [phone]   04/22/2018, 8:38 AM

## 2018-11-06 ENCOUNTER — Ambulatory Visit: Payer: PRIVATE HEALTH INSURANCE | Admitting: "Endocrinology

## 2018-11-07 LAB — TSH: TSH: 0.22 — AB (ref 0.41–5.90)

## 2018-11-11 ENCOUNTER — Other Ambulatory Visit: Payer: Self-pay

## 2018-11-11 ENCOUNTER — Ambulatory Visit: Payer: PRIVATE HEALTH INSURANCE | Admitting: "Endocrinology

## 2018-11-13 ENCOUNTER — Encounter: Payer: Self-pay | Admitting: "Endocrinology

## 2018-11-13 ENCOUNTER — Other Ambulatory Visit: Payer: Self-pay

## 2018-11-13 ENCOUNTER — Ambulatory Visit: Payer: PRIVATE HEALTH INSURANCE | Admitting: "Endocrinology

## 2018-11-13 ENCOUNTER — Ambulatory Visit (INDEPENDENT_AMBULATORY_CARE_PROVIDER_SITE_OTHER): Payer: PRIVATE HEALTH INSURANCE | Admitting: "Endocrinology

## 2018-11-13 DIAGNOSIS — E0590 Thyrotoxicosis, unspecified without thyrotoxic crisis or storm: Secondary | ICD-10-CM | POA: Diagnosis not present

## 2018-11-13 DIAGNOSIS — R634 Abnormal weight loss: Secondary | ICD-10-CM | POA: Diagnosis not present

## 2018-11-13 NOTE — Progress Notes (Signed)
11/13/2018                                Endocrinology Telehealth Visit Follow up Note -During COVID -19 Pandemic  I connected with Lorraine Gordon on 11/13/2018   by telephone and verified that I am speaking with the correct person using two identifiers. Lorraine Gordon, 28-Jan-1961. she has verbally consented to this visit. All issues noted in this document were discussed and addressed. The format was not optimal for physical exam.   Subjective:    Patient ID: Lorraine Gordon, female    DOB: [birth year], PCP Joanie Coddington, MD. Past Medical History:  Diagnosis Date  . Hyperthyroidism     Past Surgical History:  Procedure Laterality Date  . ABDOMINAL HYSTERECTOMY     Social History   Socioeconomic History  . Marital status: Single    Spouse name: Not on file  . Number of children: Not on file  . Years of education: Not on file  . Highest education level: Not on file  Occupational History  . Not on file  Social Needs  . Financial resource strain: Not on file  . Food insecurity    Worry: Not on file    Inability: Not on file  . Transportation needs    Medical: Not on file    Non-medical: Not on file  Tobacco Use  . Smoking status: Former Research scientist (life sciences)  . Smokeless tobacco: Never Used  Substance and Sexual Activity  . Alcohol use: No    Alcohol/week: 0.0 standard drinks  . Drug use: No  . Sexual activity: Not on file  Lifestyle  . Physical activity    Days per week: Not on file    Minutes per session: Not on file  . Stress: Not on file  Relationships  . Social Herbalist on phone: Not on file    Gets together: Not on file    Attends religious service: Not on file    Active member of club or organization: Not on file    Attends meetings of clubs or organizations: Not on file    Relationship status: Not on file  . Intimate partner violence    Fear of current or ex partner: Not on file    Emotionally abused: Not on file    Physically abused: Not on file   Forced sexual activity: Not on file  Other Topics Concern  . Not on file  Social History Narrative  . Not on file    . Sexual activity: Not on file  Lifestyle  . Physical activity:    Days per week: Not on file    Minutes per session: Not on file  . Stress: Not on file  Relationships  . Social connections:    Talks on phone: Not on file    Gets together: Not on file    Attends religious service: Not on file    Active member of club or organization: Not on file    Attends meetings of clubs or organizations: Not on file    Relationship status: Not on file  Other Topics Concern  . Not on file  Social History Narrative  . Not on file   Outpatient Encounter Medications as of 11/13/2018  Medication Sig  . Calcium Citrate-Vitamin D (CITRACAL MAXIMUM PO) Take by mouth daily.  . cyclobenzaprine (FLEXERIL) 10 MG tablet Take 10 mg by mouth 3 (three) times daily as  needed for muscle spasms.  . ferrous sulfate 325 (65 FE) MG EC tablet Take 325 mg by mouth 3 (three) times daily with meals.  Marland Kitchen ibuprofen (ADVIL,MOTRIN) 800 MG tablet Take 800 mg by mouth every 8 (eight) hours as needed.  . Multiple Vitamins-Minerals (CENTRUM SILVER PO) Take by mouth daily.  . Pseudoephedrine-Ibuprofen (ADVIL COLD/SINUS PO) Take by mouth daily as needed.  . sodium chloride (OCEAN) 0.65 % SOLN nasal spray Place 1 spray into both nostrils.  . [DISCONTINUED] methimazole (TAPAZOLE) 5 MG tablet Take 1 tablet (5 mg total) by mouth daily.   No facility-administered encounter medications on file as of 11/13/2018.     ALLERGIES: Allergies  Allergen Reactions  . Compazine [Prochlorperazine Edisylate]   . Phenergan [Promethazine Hcl]    VACCINATION STATUS:  There is no immunization history on file for this patient.  HPI - 58 year old female patient with medical history of subclinical hyperthyroidism with ongoing treatment with low-dose methimazole, currently 5 mg p.o. daily.    Her pre-visit thyroid function  tests are consistent with treatment response and mild iatrogenic hypothyroidism.  She reports continued weight loss, losing 10 more pounds since last visit unintentionally.  She is not up-to-date on her Pap smear, mammogram, normal colonoscopy.  She reports normal appetite.  She denies diarrhea.  The patient does not know family history of thyroid dysfunction  since she is adopted. 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   Review of systems: Limited as above  Objective:     Recent Results (from the past 2160 hour(s))  TSH     Status: Abnormal   Collection Time: 08/25/18 12:00 AM  Result Value Ref Range   TSH 0.02 (A) 0.41 - 5.90  TSH     Status: Abnormal   Collection Time: 11/07/18 12:00 AM  Result Value Ref Range   TSH 0.22 (A) 0.41 - 5.90    Comment: Free T4 0.66    Assessment & Plan:   1. Subclinical hyperthyroidism -Given her free T4 of 0.66 which is below normal range, she is advised to discontinue methimazole with plan to repeat TSH, free T4, free T3 with office visit in 3 months.    She is advised to update her screening studies including Pap smear, mammogram, and colonoscopy.  Given her unintended weight loss, she is offered baseline thyroid/neck ultrasound.   - I advised patient to maintain close follow up with [name], MD for primary care needs.  Time for this visit: 15 minutes. Lorraine Gordon  participated in the discussions, expressed understanding, and voiced agreement with the above plans.  All questions were answered to her satisfaction. she is encouraged to contact clinic should she have any questions or concerns prior to her return visit.  Follow up plan: Follow-up in 3 months with repeat thyroid function test.  Marquis Lunch, MD Phone: [phone]  Fax: [phone]   04/22/2018, 8:38 AM

## 2018-11-20 ENCOUNTER — Ambulatory Visit (HOSPITAL_COMMUNITY): Payer: Self-pay

## 2018-11-28 ENCOUNTER — Telehealth: Payer: Self-pay | Admitting: "Endocrinology

## 2018-11-28 NOTE — Telephone Encounter (Signed)
Patient states she is supposed to get a ultrasound before her next appointment and would like to know if she can only get blood work done due to her insurance not covering anything and she has 14K in medical bills from this year and would like to push that off.

## 2018-12-01 NOTE — Telephone Encounter (Signed)
Pt notified by VM 

## 2018-12-01 NOTE — Telephone Encounter (Signed)
That is OK, her next visit should be in person for exam.

## 2018-12-04 ENCOUNTER — Telehealth: Payer: Self-pay | Admitting: "Endocrinology

## 2018-12-04 NOTE — Telephone Encounter (Signed)
Pt notified that we will be sending order to schedulers and she should be receiving a call.

## 2018-12-04 NOTE — Telephone Encounter (Signed)
Pt wants to have her ultrasound done in January now. She said she is tired, losing weight and can not sleep. She wants it done at [facility]

## 2018-12-17 ENCOUNTER — Other Ambulatory Visit: Payer: Self-pay

## 2018-12-17 ENCOUNTER — Ambulatory Visit (HOSPITAL_COMMUNITY)
Admission: RE | Admit: 2018-12-17 | Discharge: 2018-12-17 | Disposition: A | Discharge status: Home / Self Care | Payer: PRIVATE HEALTH INSURANCE | Source: Ambulatory Visit | Attending: "Endocrinology | Admitting: "Endocrinology

## 2018-12-17 DIAGNOSIS — E0590 Thyrotoxicosis, unspecified without thyrotoxic crisis or storm: Secondary | ICD-10-CM | POA: Diagnosis present

## 2018-12-18 ENCOUNTER — Ambulatory Visit (HOSPITAL_COMMUNITY): Payer: Self-pay

## 2018-12-23 ENCOUNTER — Telehealth: Payer: Self-pay | Admitting: "Endocrinology

## 2018-12-23 NOTE — Telephone Encounter (Signed)
Pt would like her ultrasound results before appt

## 2018-12-23 NOTE — Telephone Encounter (Signed)
Pt is requesting labs from 12/17/2018 Thyroid U/S. Her follow up appt is not until 02/19/19

## 2018-12-24 ENCOUNTER — Other Ambulatory Visit: Payer: Self-pay

## 2018-12-24 MED ORDER — METHIMAZOLE 5 MG PO TABS: 5.0000 mg | ORAL_TABLET | Freq: Every day | ORAL | 3 refills | Status: DC

## 2018-12-24 NOTE — Telephone Encounter (Signed)
The ultrasound has no new or worrisome finding. he needs to stay on her methimazole and needs to repeat labs before her next visit.

## 2018-12-24 NOTE — Telephone Encounter (Signed)
Pt notified of results. Also refill sent to [facility] for Methimazole.

## 2019-01-16 ENCOUNTER — Telehealth: Payer: Self-pay | Admitting: "Endocrinology

## 2019-01-16 NOTE — Telephone Encounter (Signed)
Faxed lab order for patient to [facility] @ [phone]

## 2019-01-27 LAB — TSH: TSH: 0.6 (ref 0.41–5.90)

## 2019-01-28 ENCOUNTER — Telehealth: Payer: Self-pay | Admitting: "Endocrinology

## 2019-01-28 NOTE — Telephone Encounter (Signed)
Notified patient.

## 2019-01-28 NOTE — Telephone Encounter (Signed)
No change now. Keep appointment with repeat labs.

## 2019-01-28 NOTE — Telephone Encounter (Signed)
01-27-2019 TSH 0.603 FREE T4  0.82 FREE T3  4.36

## 2019-01-28 NOTE — Telephone Encounter (Signed)
I could not see her  labs from care everywhere, can we get a hard copy?

## 2019-01-28 NOTE — Telephone Encounter (Signed)
Pt wants you to look at her thyroid labs in care everywhere and let me know if she needs to cut her thyroid medication in half or discontinue

## 2019-02-18 ENCOUNTER — Encounter: Payer: Self-pay | Admitting: "Endocrinology

## 2019-02-18 ENCOUNTER — Ambulatory Visit: Payer: PRIVATE HEALTH INSURANCE | Admitting: "Endocrinology

## 2019-02-18 ENCOUNTER — Ambulatory Visit (INDEPENDENT_AMBULATORY_CARE_PROVIDER_SITE_OTHER): Payer: PRIVATE HEALTH INSURANCE | Admitting: "Endocrinology

## 2019-02-18 DIAGNOSIS — E042 Nontoxic multinodular goiter: Secondary | ICD-10-CM

## 2019-02-18 DIAGNOSIS — E0590 Thyrotoxicosis, unspecified without thyrotoxic crisis or storm: Secondary | ICD-10-CM | POA: Diagnosis not present

## 2019-02-18 NOTE — Progress Notes (Signed)
02/18/2019                                    Endocrinology Telehealth Visit Follow up Note -During COVID -19 Pandemic  I connected with [name] on 02/18/2019   by telephone and verified that I am speaking with the correct person using two identifiers. Lorraine Gordon, [birth year]. she has verbally consented to this visit. All issues noted in this document were discussed and addressed. The format was not optimal for physical exam.   Subjective:    Patient ID: Lorraine Gordon, female    DOB: 1960/07/05, PCP [name], MD. Past Medical History:  Diagnosis Date  . Hyperthyroidism     Past Surgical History:  Procedure Laterality Date  . ABDOMINAL HYSTERECTOMY     Social History   Socioeconomic History  . Marital status: Single    Spouse name: Not on file  . Number of children: Not on file  . Years of education: Not on file  . Highest education level: Not on file  Occupational History  . Not on file  Tobacco Use  . Smoking status: Former Games developer  . Smokeless tobacco: Never Used  Substance and Sexual Activity  . Alcohol use: No    Alcohol/week: 0.0 standard drinks  . Drug use: No  . Sexual activity: Not on file  Other Topics Concern  . Not on file  Social History Narrative  . Not on file   Social Determinants of Health   Financial Resource Strain:   . Difficulty of Paying Living Expenses: Not on file  Food Insecurity:   . Worried About Programme researcher, broadcasting/film/video in the Last Year: Not on file  . Ran Out of Food in the Last Year: Not on file  Transportation Needs:   . Lack of Transportation (Medical): Not on file  . Lack of Transportation (Non-Medical): Not on file  Physical Activity:   . Days of Exercise per Week: Not on file  . Minutes of Exercise per Session: Not on file  Stress:   . Feeling of Stress : Not on file  Social Connections:   . Frequency of Communication with Friends and Family: Not on file  . Frequency of Social Gatherings with Friends and Family:  Not on file  . Attends Religious Services: Not on file  . Active Member of Clubs or Organizations: Not on file  . Attends Banker Meetings: Not on file  . Marital Status: Not on file  Intimate Partner Violence:   . Fear of Current or Ex-Partner: Not on file  . Emotionally Abused: Not on file  . Physically Abused: Not on file  . Sexually Abused: Not on file    . Sexual activity: Not on file  Lifestyle  . Physical activity:    Days per week: Not on file    Minutes per session: Not on file  . Stress: Not on file  Relationships  . Social connections:    Talks on phone: Not on file    Gets together: Not on file    Attends religious service: Not on file    Active member of club or organization: Not on file    Attends meetings of clubs or organizations: Not on file    Relationship status: Not on file  Other Topics Concern  . Not on file  Social History Narrative  . Not on file   Outpatient Encounter  Medications as of 02/18/2019  Medication Sig  . Calcium Citrate-Vitamin D (CITRACAL MAXIMUM PO) Take by mouth daily.  . cyclobenzaprine (FLEXERIL) 10 MG tablet Take 10 mg by mouth 3 (three) times daily as needed for muscle spasms.  . ferrous sulfate 325 (65 FE) MG EC tablet Take 325 mg by mouth 3 (three) times daily with meals.  Marland Kitchen ibuprofen (ADVIL,MOTRIN) 800 MG tablet Take 800 mg by mouth every 8 (eight) hours as needed.  . methimazole (TAPAZOLE) 5 MG tablet Take 1 tablet (5 mg total) by mouth daily.  . Multiple Vitamins-Minerals (CENTRUM SILVER PO) Take by mouth daily.  . Pseudoephedrine-Ibuprofen (ADVIL COLD/SINUS PO) Take by mouth daily as needed.  . sodium chloride (OCEAN) 0.65 % SOLN nasal spray Place 1 spray into both nostrils.   No facility-administered encounter medications on file as of 02/18/2019.    ALLERGIES: Allergies  Allergen Reactions  . Compazine [Prochlorperazine Edisylate]   . Phenergan [Promethazine Hcl]    VACCINATION STATUS:  There is no  immunization history on file for this patient.  HPI - 59 year old female patient with medical history of subclinical hyperthyroidism, being engaged in telehealth via telephone in follow-up.  She was briefly treated with low-dose methimazole until it was stopped during her last visit.  Her previsit thyroid function tests are consistent with treatment effect, back to euthyroid state.   She has no new complaints today.  She has a stable weight, underwent thyroid ultrasound since her last visit which showed unremarkable bilateral thyroid nodules.   She is not up-to-date on her Pap smear, mammogram, normal colonoscopy.  She reports normal appetite.  She denies diarrhea.  The patient does not know family history of thyroid dysfunction  since she is adopted. Review of systems: Limited as above  Objective:     Recent Results (from the past 2160 hour(s))  TSH     Status: None   Collection Time: 01/27/19 12:00 AM  Result Value Ref Range   TSH 0.60 0.41 - 5.90    Comment: Free T4 - 0.82;  Free T3 - 4.30   Thyroid ultrasound on December 17, 2018: Right lobe measuring 5.500 with 0.9 cm partially cystic nodule, left lobe measuring 5.5 cm with questionable isoechoic nodule measuring 1.3 cm.  This was favored to be rather a pseudonodule as it lacks defined borders.  Assessment & Plan:   1. Subclinical hyperthyroidism 2.  Multinodular goiter -Given her previsit thyroid function tests within normal range, she will not need antithyroid intervention at this time.   Her thyroid ultrasound revealed multinodular goiter which will not require intervention at this time.  She will have repeat surveillance thyroid ultrasound in 1 year.  Patient will return in 6 months with repeat thyroid function tests for reevaluation.  She is advised to update her screening studies including Pap smear, mammogram, and colonoscopy.  Given her unintended weight loss, she is offered baseline thyroid/neck ultrasound.   - I advised  patient to maintain close follow up with Joanie Coddington, MD for primary care needs.     - Time spent on this patient care encounter:  25 minutes of which 50% was spent in  counseling and the rest reviewing  her current and  previous labs / studies and medications  doses and developing a plan for long term care. Augustina Muecke  participated in the discussions, expressed understanding, and voiced agreement with the above plans.  All questions were answered to her satisfaction. she is encouraged to contact clinic should she have  any questions or concerns prior to her return visit.  Follow up plan: Follow-up in 3 months with repeat thyroid function test.  Marquis Lunch, MD Phone: [phone]  Fax: [phone]   04/22/2018, 8:38 AM

## 2019-08-17 ENCOUNTER — Telehealth: Payer: Self-pay | Admitting: "Endocrinology

## 2019-08-19 NOTE — Telephone Encounter (Signed)
Yes, she can do her labs sooner.

## 2019-08-19 NOTE — Telephone Encounter (Signed)
Patient would like to know if she can go ahead and do her labs early , she said she has not been sleeping the last few nights. She had to move the appt to August. She wants to know if you could look at the results before the appt and let her know if she is on medication prior to appt. She said she would still keep the appt.

## 2019-08-19 NOTE — Telephone Encounter (Signed)
Tried to reach patient with Dr Isidoro Donning recommendation. No answerm, no VM set up

## 2019-08-20 ENCOUNTER — Ambulatory Visit: Payer: PRIVATE HEALTH INSURANCE | Admitting: "Endocrinology

## 2019-08-21 ENCOUNTER — Other Ambulatory Visit: Payer: Self-pay | Admitting: "Endocrinology

## 2019-08-21 DIAGNOSIS — E042 Nontoxic multinodular goiter: Secondary | ICD-10-CM

## 2019-08-21 LAB — T4, FREE: Free T4: 1.1 ng/dL (ref 0.8–1.8)

## 2019-08-21 LAB — T3, FREE: T3, Free: 3.3 pg/mL (ref 2.3–4.2)

## 2019-08-21 LAB — TSH: TSH: 0.1 mIU/L — ABNORMAL LOW (ref 0.40–4.50)

## 2019-08-26 ENCOUNTER — Other Ambulatory Visit: Payer: Self-pay | Admitting: "Endocrinology

## 2019-08-26 ENCOUNTER — Telehealth: Payer: Self-pay | Admitting: "Endocrinology

## 2019-08-26 MED ORDER — METHIMAZOLE 5 MG PO TABS: 2.5000 mg | ORAL_TABLET | Freq: Every day | ORAL | 3 refills | Status: DC

## 2019-08-26 NOTE — Telephone Encounter (Signed)
Pt would like you to review her recent lab.

## 2019-08-26 NOTE — Telephone Encounter (Signed)
Discussed with pt, understanding voiced. Rx for methimazole sent to [facility].

## 2019-08-26 NOTE — Telephone Encounter (Signed)
Joy, Would you inform her that we need her to resume methimazole 2.5mg  po qam at breakfast and have repeat TSH, Free T4 before her next visit?

## 2019-09-15 ENCOUNTER — Ambulatory Visit: Payer: PRIVATE HEALTH INSURANCE | Admitting: "Endocrinology

## 2019-10-26 ENCOUNTER — Telehealth: Payer: Self-pay | Admitting: "Endocrinology

## 2019-10-26 DIAGNOSIS — E042 Nontoxic multinodular goiter: Secondary | ICD-10-CM

## 2019-10-26 DIAGNOSIS — E0590 Thyrotoxicosis, unspecified without thyrotoxic crisis or storm: Secondary | ICD-10-CM

## 2019-10-26 NOTE — Telephone Encounter (Signed)
I put the orders in if you can schedule her.

## 2019-10-26 NOTE — Telephone Encounter (Signed)
Patient states she is due for a follow up appt but would like to make her next appt when she is due for blood work again. Do you know when patient will be due for blood work so we can sch accordingly.

## 2019-10-26 NOTE — Telephone Encounter (Signed)
Tried calling patient, left message letting her know I was mailing her the lab orders and to call the office back to be schedueled

## 2019-10-26 NOTE — Telephone Encounter (Signed)
Please advise 

## 2019-10-26 NOTE — Telephone Encounter (Signed)
She is due anytime now. TSH, Free t4, free t3.

## 2019-12-07 ENCOUNTER — Telehealth: Payer: Self-pay

## 2019-12-07 NOTE — Telephone Encounter (Signed)
Pt said that she has took herself off the  methimazole (TAPAZOLE) 5 MG tablet  Because she was so on edge taking it and now she feels much calmer.

## 2019-12-07 NOTE — Telephone Encounter (Signed)
Okay to hold for now, however I want her to have labs and visit as scheduled.

## 2019-12-29 LAB — TSH: TSH: 0.196 u[IU]/mL — ABNORMAL LOW (ref 0.450–4.500)

## 2019-12-29 LAB — T4, FREE: Free T4: 1.05 ng/dL (ref 0.82–1.77)

## 2019-12-29 LAB — T3, FREE: T3, Free: 3.4 pg/mL (ref 2.0–4.4)

## 2020-01-04 ENCOUNTER — Telehealth: Payer: Self-pay | Admitting: "Endocrinology

## 2020-01-04 NOTE — Telephone Encounter (Signed)
Please advise 

## 2020-01-04 NOTE — Telephone Encounter (Signed)
Pt called and said she has been working out of town a lot and can not get to [employer] she would like to know if Dr. Fransico Him could do a phone visit to go over lab results. Please advise.

## 2020-01-04 NOTE — Telephone Encounter (Signed)
Yes

## 2020-01-04 NOTE — Telephone Encounter (Signed)
sch pt for 12/7

## 2020-01-05 ENCOUNTER — Telehealth: Payer: PRIVATE HEALTH INSURANCE | Admitting: "Endocrinology

## 2020-01-05 NOTE — Telephone Encounter (Signed)
I called pt this morning to get her insurance information for her appt as we do not have anything on file and nothing has been scanned in since 2019. Pt states she thought we already had this information and there is not a way for her to get [place] the insurance information right now and will cb to resch.

## 2020-01-12 ENCOUNTER — Encounter: Payer: Self-pay | Admitting: "Endocrinology

## 2020-01-12 ENCOUNTER — Telehealth (INDEPENDENT_AMBULATORY_CARE_PROVIDER_SITE_OTHER): Payer: PRIVATE HEALTH INSURANCE | Admitting: "Endocrinology

## 2020-01-12 VITALS — BP 120/50 | Ht 63.0 in | Wt 131.0 lb

## 2020-01-12 DIAGNOSIS — E0590 Thyrotoxicosis, unspecified without thyrotoxic crisis or storm: Secondary | ICD-10-CM

## 2020-01-12 NOTE — Progress Notes (Signed)
Pt states she's experiencing difficulty sleeping for the past two nights. States she had not been taking methimazole but did take it for the past 2 days and was able to sleep better.

## 2020-01-12 NOTE — Progress Notes (Signed)
01/12/2020                                                  Endocrinology Telehealth Visit Follow up Note -During COVID -19 Pandemic  This visit type was conducted  via telephone due to national recommendations for restrictions regarding the COVID-19 Pandemic  in an effort to limit this patient's exposure and mitigate transmission of the corona virus.   I connected with Lorraine Gordon on 01/12/2020   by telephone and verified that I am speaking with the correct person using two identifiers. Lorraine Gordon, 07-Jul-1960. she has verbally consented to this visit.  I was in my office and patient was in her residence. No other persons were with me during the encounter. All issues noted in this document were discussed and addressed. The format was not optimal for physical exam.  Subjective:    Patient ID: Lorraine Gordon, female    DOB: [birth year], PCP Lorraine Dinning, MD. Past Medical History:  Diagnosis Date  . Hyperthyroidism     Past Surgical History:  Procedure Laterality Date  . ABDOMINAL HYSTERECTOMY     Social History   Socioeconomic History  . Marital status: Single    Spouse name: Not on file  . Number of children: Not on file  . Years of education: Not on file  . Highest education level: Not on file  Occupational History  . Not on file  Tobacco Use  . Smoking status: Former Games developer  . Smokeless tobacco: Never Used  Vaping Use  . Vaping Use: Never used  Substance and Sexual Activity  . Alcohol use: No    Alcohol/week: 0.0 standard drinks  . Drug use: No  . Sexual activity: Not on file  Other Topics Concern  . Not on file  Social History Narrative  . Not on file   Social Determinants of Health   Financial Resource Strain: Not on file  Food Insecurity: Not on file  Transportation Needs: Not on file  Physical Activity: Not on file  Stress: Not on file  Social Connections: Not on file  Intimate Partner Violence: Not on file     Outpatient Encounter Medications  as of 01/12/2020  Medication Sig  . ibuprofen (ADVIL,MOTRIN) 800 MG tablet Take 800 mg by mouth every 8 (eight) hours as needed.  Marland Kitchen MAGNESIUM OXIDE PO Take 1 tablet by mouth daily in the afternoon.  . Multiple Vitamins-Minerals (CENTRUM SILVER PO) Take by mouth daily.  . Pseudoephedrine-Ibuprofen (ADVIL COLD/SINUS PO) Take by mouth daily as needed.  . sodium chloride (OCEAN NASAL SPRAY) 0.65 % nasal spray Place 1 spray into the nose as needed for congestion.  . TURMERIC PO Take 1 capsule by mouth daily in the afternoon.  . [DISCONTINUED] Calcium Citrate-Vitamin D (CITRACAL MAXIMUM PO) Take by mouth daily.  . [DISCONTINUED] cyclobenzaprine (FLEXERIL) 10 MG tablet Take 10 mg by mouth 3 (three) times daily as needed for muscle spasms.  . [DISCONTINUED] ferrous sulfate 325 (65 FE) MG EC tablet Take 325 mg by mouth 3 (three) times daily with meals.  . [DISCONTINUED] methimazole (TAPAZOLE) 5 MG tablet Take 0.5 tablets (2.5 mg total) by mouth daily.  . [DISCONTINUED] sodium chloride (OCEAN) 0.65 % SOLN nasal spray Place 1 spray into both nostrils.   No facility-administered encounter medications on file as of 01/12/2020.    ALLERGIES: Allergies  Allergen Reactions  . Compazine [Prochlorperazine Edisylate]   . Phenergan [Promethazine Hcl]    VACCINATION STATUS:  There is no immunization history on file for this patient.  HPI -59 year old female patient  with medical history of subclinical hyperthyroidism, being engaged in telehealth via telephone in follow-up.  She was briefly treated with low-dose methimazole until it was stopped during her last visit.  Her previsit thyroid function tests are consistent with treatment effect, back to euthyroid state.   She has no new complaints today, she has gained 5 pounds since last visit, a positive development for her.  - underwent thyroid ultrasound since her last visit which showed unremarkable bilateral thyroid nodules.   She is not up-to-date on her  Pap smear, mammogram, normal colonoscopy.  She reports normal appetite.  She denies diarrhea.  The patient does not know family history of thyroid dysfunction  since she is adopted. Review of systems: Limited as above  Objective:     Vitals with BMI 01/12/2020 01/17/2018 10/16/2017  Height 5\' 3"  5\' 1"  5\' 1"   Weight 131 lbs 132 lbs 127 lbs  BMI 23.21 24.95 24.01  Systolic 120 121  Diastolic 50 82 73  Pulse - 82 73     Recent Results (from the past 2160 hour(s))  T4, Free     Status: None   Collection Time: 12/28/19 10:01 AM  Result Value Ref Range   Free T4 1.05 0.82 - 1.77 ng/dL  TSH     Status: Abnormal   Collection Time: 12/28/19 10:01 AM  Result Value Ref Range   TSH 0.196 (L) 0.450 - 4.500 uIU/mL  T3, Free     Status: None   Collection Time: 12/28/19 10:01 AM  Result Value Ref Range   T3, Free 3.4 2.0 - 4.4 pg/mL   Thyroid ultrasound on December 17, 2018: Right lobe measuring 5.500 with 0.9 cm partially cystic nodule, left lobe measuring 5.5 cm with questionable isoechoic nodule measuring 1.3 cm.  This was favored to be rather a pseudonodule as it lacks defined borders.  Assessment & Plan:   1. Subclinical hyperthyroidism 2.  Multinodular goiter -Given her previsit thyroid function tests within normal range, she will not need antithyroid intervention at this time.  She is advised to stay off of methimazole until next measurement in 3-4 months. Her thyroid ultrasound revealed multinodular goiter which will not require intervention at this time.  She will have repeat surveillance thyroid ultrasound in 1 year.    She is advised to update her screening studies including Pap smear, mammogram, and colonoscopy.  Given her unintended weight loss, she is offered baseline thyroid/neck ultrasound.    - I advised patient to maintain close follow up with 12/30/19, MD for primary care needs.     - Time spent on this patient care encounter:  20 minutes of which 50% was  spent in  counseling and the rest reviewing  her current and  previous labs / studies and medications  doses and developing a plan for long term care. [name]  participated in the discussions, expressed understanding, and voiced agreement with the above plans.  All questions were answered to her satisfaction. she is encouraged to contact clinic should she have any questions or concerns prior to her return visit.   Follow up plan: Follow-up in 3 months with repeat thyroid function test.  12/30/19, MD Phone: [phone]  Fax: [phone]   04/22/2018, 8:38 AM

## 2020-04-11 ENCOUNTER — Ambulatory Visit: Payer: PRIVATE HEALTH INSURANCE | Admitting: "Endocrinology

## 2020-04-29 ENCOUNTER — Ambulatory Visit: Payer: PRIVATE HEALTH INSURANCE | Admitting: "Endocrinology

## 2020-05-06 ENCOUNTER — Ambulatory Visit: Payer: PRIVATE HEALTH INSURANCE | Admitting: "Endocrinology

## 2020-05-07 LAB — T4, FREE: Free T4: 1.06 ng/dL (ref 0.82–1.77)

## 2020-05-07 LAB — T3, FREE: T3, Free: 3.2 pg/mL (ref 2.0–4.4)

## 2020-05-07 LAB — TSH: TSH: 0.311 u[IU]/mL — ABNORMAL LOW (ref 0.450–4.500)

## 2020-05-12 ENCOUNTER — Encounter: Payer: Self-pay | Admitting: "Endocrinology

## 2020-05-12 ENCOUNTER — Ambulatory Visit (INDEPENDENT_AMBULATORY_CARE_PROVIDER_SITE_OTHER): Payer: PRIVATE HEALTH INSURANCE | Admitting: "Endocrinology

## 2020-05-12 ENCOUNTER — Other Ambulatory Visit: Payer: Self-pay

## 2020-05-12 VITALS — BP 108/58 | HR 80 | Ht 64.0 in | Wt 131.4 lb

## 2020-05-12 DIAGNOSIS — E042 Nontoxic multinodular goiter: Secondary | ICD-10-CM | POA: Diagnosis not present

## 2020-05-12 DIAGNOSIS — E0590 Thyrotoxicosis, unspecified without thyrotoxic crisis or storm: Secondary | ICD-10-CM

## 2020-05-12 NOTE — Progress Notes (Signed)
05/12/2020           Endocrinology follow-up note   Subjective:    Patient ID: Lorraine Gordon, female    DOB: 11-17-1960, PCP [name], MD. Past Medical History:  Diagnosis Date  . Hyperthyroidism     Past Surgical History:  Procedure Laterality Date  . ABDOMINAL HYSTERECTOMY     Social History   Socioeconomic History  . Marital status: Single    Spouse name: Not on file  . Number of children: Not on file  . Years of education: Not on file  . Highest education level: Not on file  Occupational History  . Not on file  Tobacco Use  . Smoking status: Former Games developer  . Smokeless tobacco: Never Used  Vaping Use  . Vaping Use: Never used  Substance and Sexual Activity  . Alcohol use: No    Alcohol/week: 0.0 standard drinks  . Drug use: No  . Sexual activity: Not on file  Other Topics Concern  . Not on file  Social History Narrative  . Not on file   Social Determinants of Health   Financial Resource Strain: Not on file  Food Insecurity: Not on file  Transportation Needs: Not on file  Physical Activity: Not on file  Stress: Not on file  Social Connections: Not on file  Intimate Partner Violence: Not on file     Outpatient Encounter Medications as of 05/12/2020  Medication Sig  . MAGNESIUM OXIDE PO Take 1 tablet by mouth daily in the afternoon. (Patient not taking: Reported on 05/12/2020)  . meloxicam (MOBIC) 15 MG tablet Take 1 tablet by mouth daily as needed.  . Multiple Vitamins-Minerals (CENTRUM SILVER PO) Take by mouth daily.  . sodium chloride (OCEAN NASAL SPRAY) 0.65 % nasal spray Place 1 spray into the nose as needed for congestion.  . TURMERIC PO Take 1 capsule by mouth daily in the afternoon.  . [DISCONTINUED] ibuprofen (ADVIL,MOTRIN) 800 MG tablet Take 800 mg by mouth every 8 (eight) hours as needed. (Patient not taking: Reported on 05/12/2020)  . [DISCONTINUED] Pseudoephedrine-Ibuprofen (ADVIL COLD/SINUS PO) Take by mouth daily as needed.   No  facility-administered encounter medications on file as of 05/12/2020.    ALLERGIES: Allergies  Allergen Reactions  . Compazine [Prochlorperazine Edisylate]   . Phenergan [Promethazine Hcl]    VACCINATION STATUS:  There is no immunization history on file for this patient.  HPI -60 year old female patient with medical history of subclinical hyperthyroidism, returns with repeat thyroid function test for follow-up.     She was briefly treated with low-dose methimazole until it was stopped during her last visit.  Her previsit thyroid function tests are consistent with treatment effect, back to euthyroid state.   She has no new complaints today.  She has steady weight, denies palpitations, tremors, heat/cold intolerance.   - underwent thyroid ultrasound since her last visit which showed unremarkable bilateral thyroid nodules.   She is not up-to-date on her Pap smear, mammogram, normal colonoscopy.  She reports normal appetite.  She denies diarrhea.  The patient does not know family history of thyroid dysfunction  since she is adopted. Review of systems: Limited as above  Objective:     Vitals with BMI 05/12/2020 01/12/2020 01/17/2018  Height 5\' 4"  5\' 3"  5\' 1"   Weight 131 lbs 6 oz 131 lbs 132 lbs  BMI 22.54 23.21 24.95  Systolic 108 120  Diastolic 58 50 82  Pulse 80 - 82     Recent Results (  from the past 2160 hour(s))  TSH     Status: Abnormal   Collection Time: 05/06/20  9:24 AM  Result Value Ref Range   TSH 0.311 (L) 0.450 - 4.500 uIU/mL  T4, free     Status: None   Collection Time: 05/06/20  9:24 AM  Result Value Ref Range   Free T4 1.06 0.82 - 1.77 ng/dL  T3, free     Status: None   Collection Time: 05/06/20  9:24 AM  Result Value Ref Range   T3, Free 3.2 2.0 - 4.4 pg/mL   Thyroid ultrasound on December 17, 2018: Right lobe measuring 5.500 with 0.9 cm partially cystic nodule, left lobe measuring 5.5 cm with questionable isoechoic nodule measuring 1.3 cm.  This was  favored to be rather a pseudonodule as it lacks defined borders.  Assessment & Plan:   1. Subclinical hyperthyroidism 2.  Multinodular goiter -Given her previsit thyroid function tests close to normal range, she would not need antithyroid intervention nor thyroid hormone replacement at this time.   Her thyroid ultrasound revealed multinodular goiter which will not require intervention at this time.  She will have repeat physical exam in a year, and will present with repeat thyroid function tests.    She is advised to update her screening studies including Pap smear, mammogram, and colonoscopy.       - I advised patient to maintain close follow up with [name], MD for primary care needs.   I spent 25 minutes in the care of the patient today including review of labs from Thyroid Function, CMP, and other relevant labs ; imaging/biopsy records (current and previous including abstractions from other facilities); face-to-face time discussing  her lab results and symptoms, medications doses, her options of short and long term treatment based on the latest standards of care / guidelines;   and documenting the encounter.  Curtistine Sailer  participated in the discussions, expressed understanding, and voiced agreement with the above plans.  All questions were answered to her satisfaction. she is encouraged to contact clinic should she have any questions or concerns prior to her return visit.    Follow up plan: Follow-up in 3 months with repeat thyroid function test.  Marquis Lunch, MD Phone: [phone]  Fax: [phone]   04/22/2018, 8:38 AM

## 2020-06-08 NOTE — Telephone Encounter (Signed)
error 

## 2021-05-12 ENCOUNTER — Ambulatory Visit: Payer: PRIVATE HEALTH INSURANCE | Admitting: "Endocrinology

## 2021-05-15 ENCOUNTER — Other Ambulatory Visit: Payer: Self-pay

## 2021-05-15 ENCOUNTER — Telehealth: Payer: Self-pay | Admitting: "Endocrinology

## 2021-05-15 DIAGNOSIS — E0590 Thyrotoxicosis, unspecified without thyrotoxic crisis or storm: Secondary | ICD-10-CM

## 2021-05-15 NOTE — Telephone Encounter (Signed)
I have updated the lab orders. ?

## 2021-05-15 NOTE — Telephone Encounter (Signed)
Can you update her labs so I can mail a copy to her today? Thanks ?

## 2021-06-01 ENCOUNTER — Ambulatory Visit: Payer: PRIVATE HEALTH INSURANCE | Admitting: "Endocrinology

## 2021-07-07 ENCOUNTER — Ambulatory Visit: Payer: PRIVATE HEALTH INSURANCE | Admitting: "Endocrinology

## 2021-07-11 LAB — T4, FREE: Free T4: 1 ng/dL (ref 0.82–1.77)

## 2021-07-11 LAB — T3, FREE: T3, Free: 3.7 pg/mL (ref 2.0–4.4)

## 2021-07-11 LAB — TSH: TSH: 0.084 u[IU]/mL — ABNORMAL LOW (ref 0.450–4.500)

## 2021-07-21 ENCOUNTER — Encounter: Payer: Self-pay | Admitting: "Endocrinology

## 2021-07-21 ENCOUNTER — Ambulatory Visit (INDEPENDENT_AMBULATORY_CARE_PROVIDER_SITE_OTHER): Payer: PRIVATE HEALTH INSURANCE | Admitting: "Endocrinology

## 2021-07-21 VITALS — BP 104/72 | HR 72 | Ht 63.0 in | Wt 134.8 lb

## 2021-07-21 DIAGNOSIS — E0590 Thyrotoxicosis, unspecified without thyrotoxic crisis or storm: Secondary | ICD-10-CM

## 2021-07-21 DIAGNOSIS — E042 Nontoxic multinodular goiter: Secondary | ICD-10-CM | POA: Diagnosis not present

## 2021-07-21 MED ORDER — METHIMAZOLE 5 MG PO TABS: 2.5000 mg | ORAL_TABLET | Freq: Every day | ORAL | 1 refills | Status: DC

## 2021-10-14 IMAGING — US US THYROID
1 series · 13 of 25 positions shown · non-contrast
Comparison: None.

CLINICAL DATA: Hyperthyroid.

EXAM:
THYROID ULTRASOUND
TECHNIQUE: Ultrasound examination of the thyroid gland and adjacent soft
tissues was performed.

[Series 1: us thyroid · 13 of 71 slices shown]
[im 1/71]
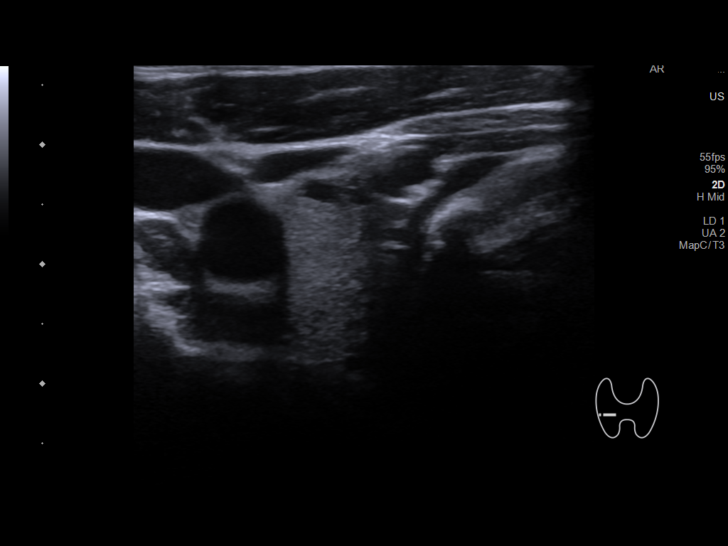
[im 6/71]
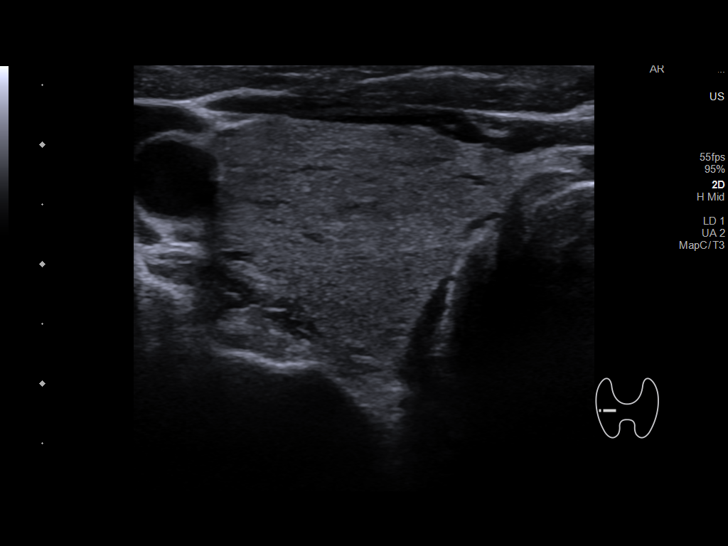
[im 12/71]
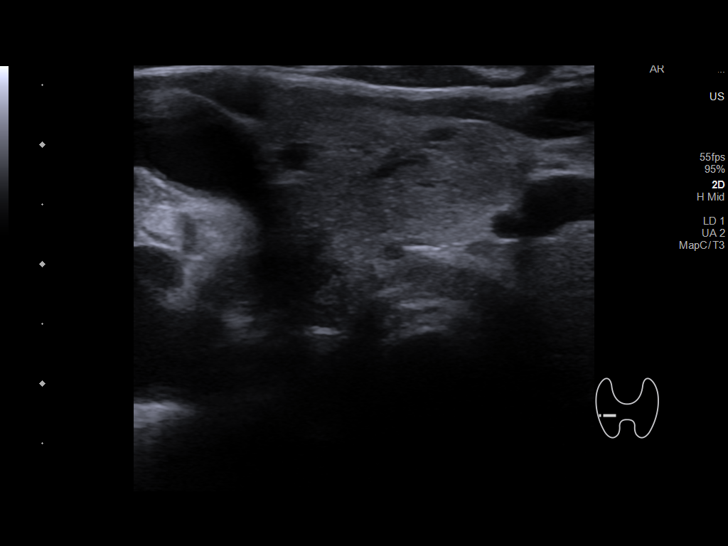
[im 18/71]
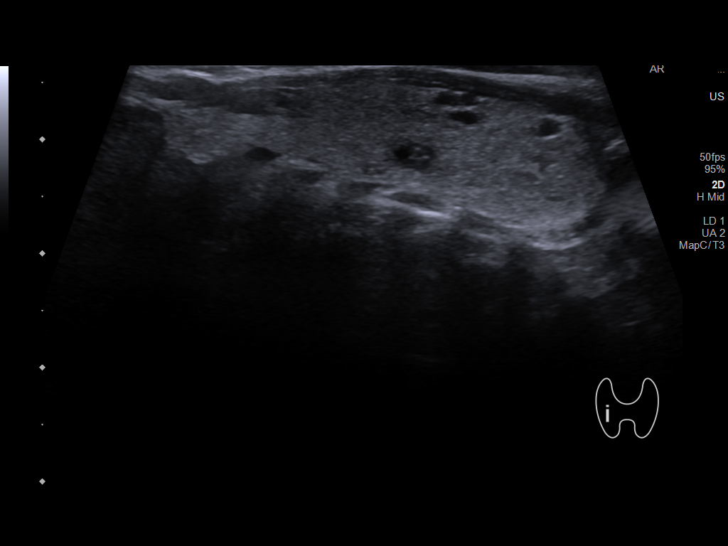
[im 24/71]
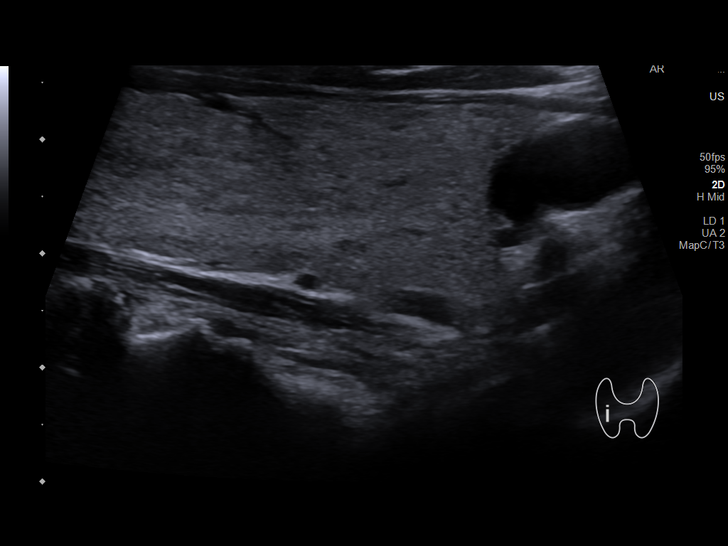
[im 30/71]
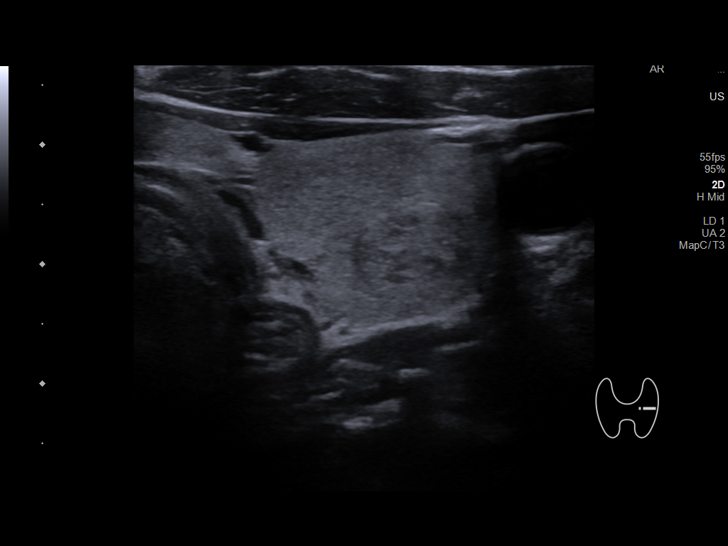
[im 36/71]
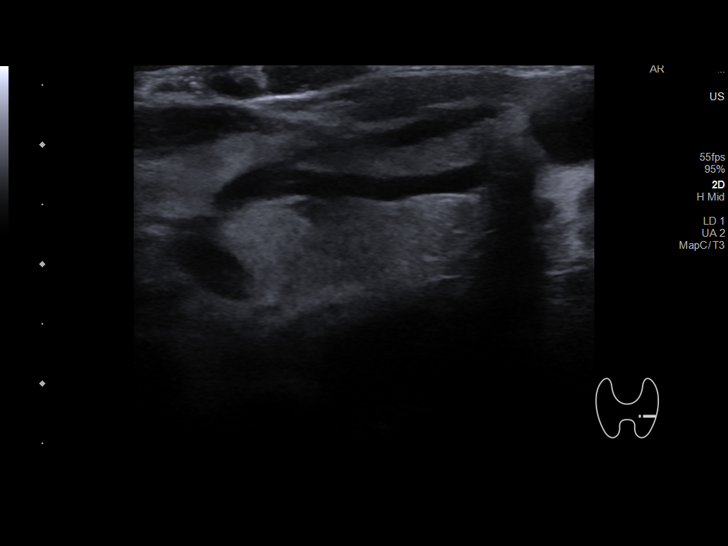
[im 41/71]
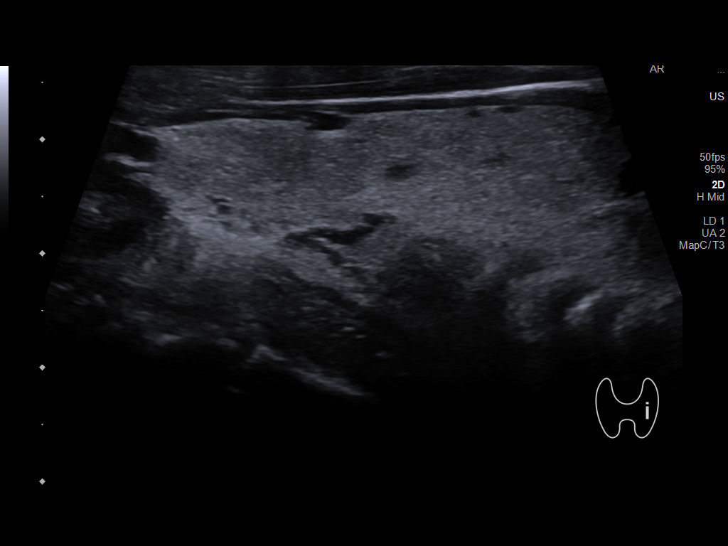
[im 47/71]
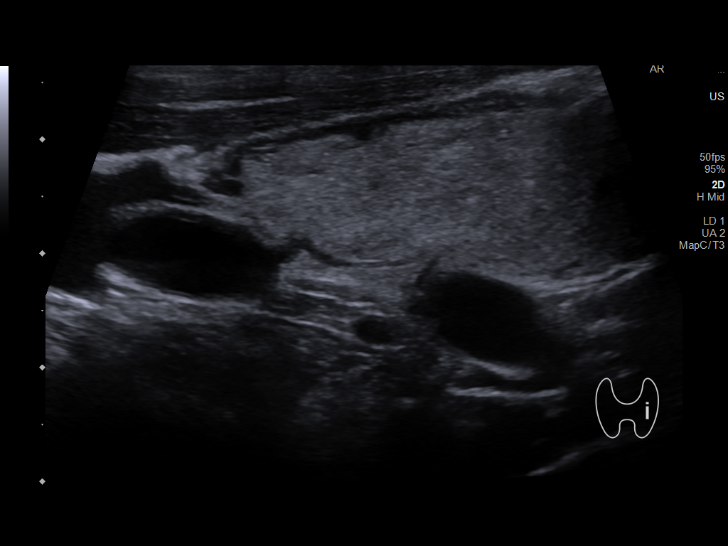
[im 53/71]
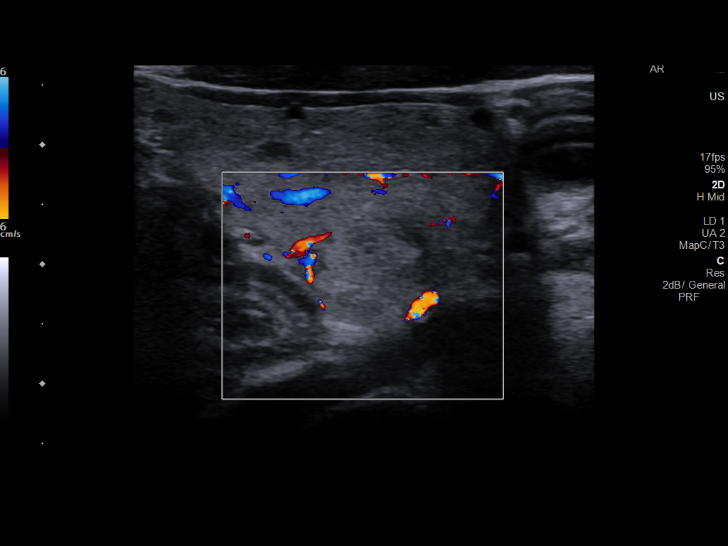
[im 59/71]
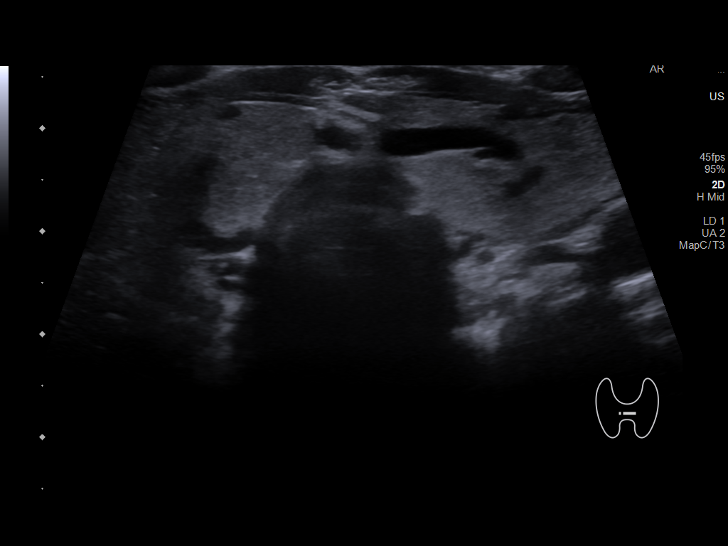
[im 65/71]
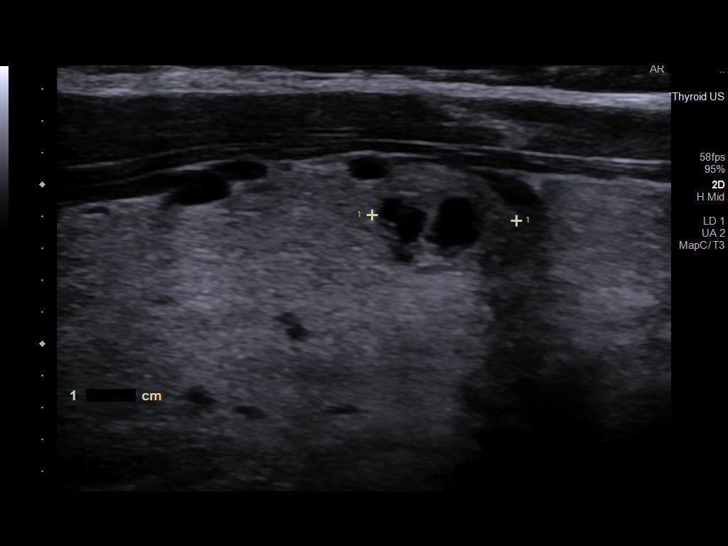
[im 71/71]
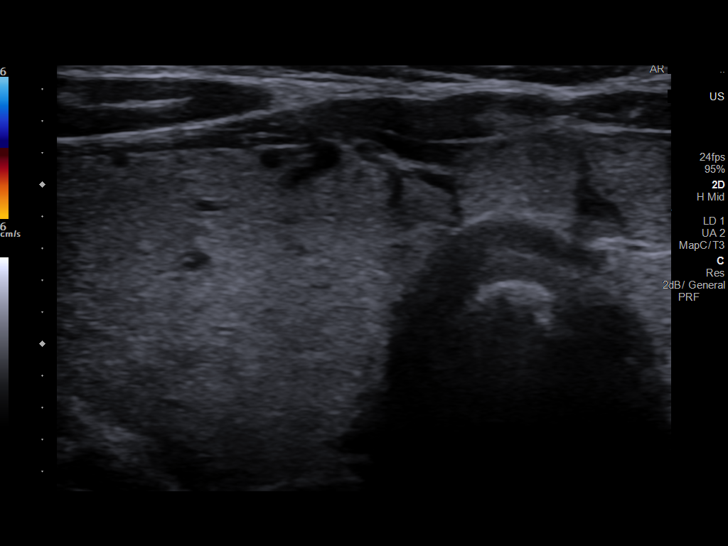

[13 of 25 positions shown; findings below may reference images not displayed]

FINDINGS: Parenchymal Echotexture: Mildly heterogenous

Isthmus: Normal in size measures 0.6 cm in diameter

Right lobe: Borderline enlarged measuring 5.5 x 2.6 x 2.5 cm

Left lobe: Borderline enlarged measuring 5.5 x 2.3 x 2.2 cm

_________________________________________________________

Estimated total number of nodules >/= 1 cm: 1

Number of spongiform nodules >/=  2 cm not described below (TR1): 0

Number of mixed cystic and solid nodules >/= 1.5 cm not described
below (TR2): 0

_________________________________________________________

There is an approximately 0.9 x 0.8 x 0.6 cm mixed partially cystic,
partially solid nodule within the right-side of the thyroid isthmus
(labeled 1), which does not meet imaging criteria to recommend
percutaneous sampling or continued dedicated follow-up.

Questioned approximately 1.3 x 1.1 x 1.0 cm isoechoic nodule within
mid aspect of the left lobe of the thyroid (labeled 2), is favored
to represent a pseudonodule as it lacks defined borders on both
provided transverse and sagittal images.
IMPRESSION: Mildly heterogeneous and borderline enlarged thyroid gland without
worrisome thyroid nodule mass.

The above is in keeping with the ACR TI-RADS recommendations - [HOSPITAL] 3061;[DATE].

## 2021-10-20 ENCOUNTER — Ambulatory Visit (HOSPITAL_COMMUNITY)
Admission: RE | Admit: 2021-10-20 | Discharge: 2021-10-20 | Disposition: A | Discharge status: Home / Self Care | Payer: PRIVATE HEALTH INSURANCE | Source: Ambulatory Visit | Attending: "Endocrinology | Admitting: "Endocrinology

## 2021-10-20 DIAGNOSIS — E0590 Thyrotoxicosis, unspecified without thyrotoxic crisis or storm: Secondary | ICD-10-CM | POA: Insufficient documentation

## 2021-10-20 DIAGNOSIS — E042 Nontoxic multinodular goiter: Secondary | ICD-10-CM | POA: Diagnosis present

## 2021-10-21 LAB — TSH: TSH: 1.01 u[IU]/mL (ref 0.450–4.500)

## 2021-10-21 LAB — T3, FREE: T3, Free: 2.8 pg/mL (ref 2.0–4.4)

## 2021-10-21 LAB — T4, FREE: Free T4: 0.64 ng/dL — ABNORMAL LOW (ref 0.82–1.77)

## 2021-10-23 ENCOUNTER — Telehealth: Payer: Self-pay | Admitting: "Endocrinology

## 2021-10-23 NOTE — Telephone Encounter (Signed)
F/u  Patient calling back regarding previous message .

## 2021-10-23 NOTE — Telephone Encounter (Signed)
Pt left a Vm stating she sees that her ultrasound has abnormalities on her results. She is asking if she needs a med change or stop taking it? She has an appt on 9/29. Please advise.

## 2021-10-24 NOTE — Telephone Encounter (Signed)
Spoke with pt, advised her that Dr.Nida would review and discuss her labs at her upcoming visit on the 29th and will make any adjustments at that time per Dr.Nida. Understanding voiced.

## 2021-10-24 NOTE — Telephone Encounter (Signed)
No answer no VM

## 2021-10-27 ENCOUNTER — Ambulatory Visit (INDEPENDENT_AMBULATORY_CARE_PROVIDER_SITE_OTHER): Payer: PRIVATE HEALTH INSURANCE | Admitting: "Endocrinology

## 2021-10-27 ENCOUNTER — Encounter: Payer: Self-pay | Admitting: "Endocrinology

## 2021-10-27 VITALS — BP 108/66 | HR 60 | Ht 63.0 in | Wt 130.6 lb

## 2021-10-27 DIAGNOSIS — E0590 Thyrotoxicosis, unspecified without thyrotoxic crisis or storm: Secondary | ICD-10-CM

## 2021-10-27 DIAGNOSIS — E042 Nontoxic multinodular goiter: Secondary | ICD-10-CM

## 2021-10-27 NOTE — Progress Notes (Signed)
10/27/2021        Endocrinology follow-up note  Subjective:    Patient ID: Lorraine Gordon, female    DOB: [birth year], PCP Joanie Coddington, MD. Past Medical History:  Diagnosis Date   Hyperthyroidism     Past Surgical History:  Procedure Laterality Date   ABDOMINAL HYSTERECTOMY     Social History   Socioeconomic History   Marital status: Single    Spouse name: Not on file   Number of children: Not on file   Years of education: Not on file   Highest education level: Not on file  Occupational History   Not on file  Tobacco Use   Smoking status: Former   Smokeless tobacco: Never  Vaping Use   Vaping Use: Never used  Substance and Sexual Activity   Alcohol use: No    Alcohol/week: 0.0 standard drinks of alcohol   Drug use: No   Sexual activity: Not on file  Other Topics Concern   Not on file  Social History Narrative   Not on file   Social Determinants of Health   Financial Resource Strain: Not on file  Food Insecurity: Not on file  Transportation Needs: Not on file  Physical Activity: Not on file  Stress: Not on file  Social Connections: Not on file  Intimate Partner Violence: Not on file     Outpatient Encounter Medications as of 10/27/2021  Medication Sig   fluticasone (FLONASE ALLERGY RELIEF) 50 MCG/ACT nasal spray Place into both nostrils daily.   sodium chloride (OCEAN NASAL SPRAY) 0.65 % nasal spray Place 1 spray into the nose as needed for congestion.   TURMERIC PO Take 1 capsule by mouth daily in the afternoon.   VITAMIN E PO Take 1 tablet by mouth daily.   [DISCONTINUED] Ascorbic Acid (VITAMIN C PO) Take 1 tablet by mouth daily.   [DISCONTINUED] methimazole (TAPAZOLE) 5 MG tablet Take 0.5 tablets (2.5 mg total) by mouth daily with breakfast.   [DISCONTINUED] Multiple Vitamins-Minerals (CENTRUM SILVER PO) Take by mouth daily.   No facility-administered encounter medications on file as of 10/27/2021.    ALLERGIES: Allergies  Allergen Reactions    Compazine [Prochlorperazine Edisylate]    Phenergan [Promethazine Hcl]    VACCINATION STATUS:  There is no immunization history on file for this patient.  HPI -61 year old female patient with medical history of subclinical hyperthyroidism, returns with repeat thyroid function test for follow-up.     She was briefly treated with low-dose methimazole to which she has responded with mild iatrogenic hypothyroidism.  She is stopped his methimazole 2 days ago.  She has no new complaints today.  She continues to feel better clinically.   She has steady weight, denies palpitations, tremors, heat/cold intolerance.  She reports normal appetite.  She denies diarrhea.  The patient does not know family history of thyroid dysfunction  since she is adopted. Review of systems: Limited as above  Objective:        10/27/2021    8:42 AM 07/21/2021    8:18 AM 05/12/2020   10:26 AM  Vitals with BMI  Height 5\' 3"  5\' 3"  5\' 4"   Weight 130 lbs 10 oz 134 lbs 13 oz 131 lbs 6 oz  BMI 23.14 76.16 07.37  Systolic 106 269 485  Diastolic 66 72 58  Pulse 60 72 80     Recent Results (from the past 2160 hour(s))  TSH     Status: None   Collection Time: 10/20/21  8:17 AM  Result Value Ref Range   TSH 1.010 0.450 - 4.500 uIU/mL  T4, free     Status: Abnormal   Collection Time: 10/20/21  8:17 AM  Result Value Ref Range   Free T4 0.64 (L) 0.82 - 1.77 ng/dL  T3, free     Status: None   Collection Time: 10/20/21  8:17 AM  Result Value Ref Range   T3, Free 2.8 2.0 - 4.4 pg/mL   Thyroid ultrasound on December 17, 2018: Right lobe measuring 5.500 with 0.9 cm partially cystic nodule, left lobe measuring 5.5 cm with questionable isoechoic nodule measuring 1.3 cm.  This was favored to be rather a pseudonodule as it lacks defined borders.   On October 20, 2021 Repeat thyroid ultrasound before this visit showed right lobe slightly bigger at 6.1 cm, left lobe 5.5 centimeters unchanged from previous study. She has  a nodule measuring 1 cm in the isthmus and another nodule measuring 1.4 cm and left lobe of the thyroid.  Nodules are described as benign.  Assessment & Plan:   1. Subclinical hyperthyroidism-resolved 2.  Multinodular goiter -Her thyroid function tests are consistent with mild iatrogenic hypothyroidism.  She is advised to stay off of methimazole until next measurement.  I discussed her thyroid ultrasound which has multiple nodules with benign descriptions.  She will not need biopsy or surgery at this time.     She is advised to update her screening studies including Pap smear, mammogram, and colonoscopy.       - I advised patient to maintain close follow up with [name], MD for primary care needs.   I spent 22 minutes in the care of the patient today including review of labs from Thyroid Function, CMP, and other relevant labs ; imaging/biopsy records (current and previous including abstractions from other facilities); face-to-face time discussing  her lab results and symptoms, medications doses, her options of short and long term treatment based on the latest standards of care / guidelines;   and documenting the encounter.  Kerstyn Ailes  participated in the discussions, expressed understanding, and voiced agreement with the above plans.  All questions were answered to her satisfaction. she is encouraged to contact clinic should she have any questions or concerns prior to her return visit.     Follow up plan: Follow-up in 3 months with repeat thyroid function test.  Marquis Lunch, MD Phone: [phone]  Fax: [phone]   04/22/2018, 8:38 AM

## 2021-11-29 HISTORY — PX: BONE MARROW BIOPSY: SHX199

## 2021-12-30 ENCOUNTER — Other Ambulatory Visit: Payer: Self-pay | Admitting: "Endocrinology

## 2022-01-26 ENCOUNTER — Ambulatory Visit: Payer: PRIVATE HEALTH INSURANCE | Admitting: "Endocrinology

## 2022-02-10 LAB — T3, FREE: T3, Free: 3.2 pg/mL (ref 2.0–4.4)

## 2022-02-10 LAB — TSH: TSH: 0.314 u[IU]/mL — ABNORMAL LOW (ref 0.450–4.500)

## 2022-02-10 LAB — T4, FREE: Free T4: 1.03 ng/dL (ref 0.82–1.77)

## 2022-02-28 ENCOUNTER — Encounter: Payer: Self-pay | Admitting: "Endocrinology

## 2022-02-28 ENCOUNTER — Ambulatory Visit (INDEPENDENT_AMBULATORY_CARE_PROVIDER_SITE_OTHER): Payer: Self-pay | Admitting: "Endocrinology

## 2022-02-28 VITALS — BP 100/58 | HR 96 | Ht 63.0 in | Wt 132.2 lb

## 2022-02-28 DIAGNOSIS — E0590 Thyrotoxicosis, unspecified without thyrotoxic crisis or storm: Secondary | ICD-10-CM

## 2022-02-28 DIAGNOSIS — E042 Nontoxic multinodular goiter: Secondary | ICD-10-CM

## 2022-02-28 NOTE — Progress Notes (Signed)
02/28/2022        Endocrinology follow-up note  Subjective:    Patient ID: Lorraine Gordon, female    DOB: August 16, 1960, PCP Joanie Coddington, MD. Past Medical History:  Diagnosis Date   Hyperthyroidism     Past Surgical History:  Procedure Laterality Date   ABDOMINAL HYSTERECTOMY     BONE MARROW BIOPSY  11/2021   Social History   Socioeconomic History   Marital status: Single    Spouse name: Not on file   Number of children: Not on file   Years of education: Not on file   Highest education level: Not on file  Occupational History   Not on file  Tobacco Use   Smoking status: Former   Smokeless tobacco: Never  Vaping Use   Vaping Use: Never used  Substance and Sexual Activity   Alcohol use: No    Alcohol/week: 0.0 standard drinks of alcohol   Drug use: No   Sexual activity: Not on file  Other Topics Concern   Not on file  Social History Narrative   Not on file   Social Determinants of Health   Financial Resource Strain: Not on file  Food Insecurity: Not on file  Transportation Needs: Not on file  Physical Activity: Not on file  Stress: Not on file  Social Connections: Not on file  Intimate Partner Violence: Not on file     Outpatient Encounter Medications as of 02/28/2022  Medication Sig   Acetaminophen (TYLENOL EXTRA STRENGTH PO) Take by mouth as needed.   Loratadine (CLARITIN PO) Take 1 tablet by mouth daily.   Multiple Vitamin (MULTIVITAMIN ADULT PO) Take 1 tablet by mouth daily.   sodium chloride (OCEAN NASAL SPRAY) 0.65 % nasal spray Place 1 spray into the nose as needed for congestion.   TURMERIC PO Take 1 capsule by mouth daily in the afternoon.   [DISCONTINUED] fluticasone (FLONASE ALLERGY RELIEF) 50 MCG/ACT nasal spray Place into both nostrils daily.   [DISCONTINUED] VITAMIN E PO Take 1 tablet by mouth daily.   No facility-administered encounter medications on file as of 02/28/2022.    ALLERGIES: Allergies  Allergen Reactions   Compazine  [Prochlorperazine Edisylate]    Phenergan [Promethazine Hcl]    VACCINATION STATUS:  There is no immunization history on file for this patient.  HPI -62 year old female patient with medical history of subclinical hyperthyroidism, returns with repeat thyroid function test for follow-up.     After she was briefly treated with low-dose methimazole to which she has responded with mild iatrogenic hypothyroidism, she was taken off of it during her last visit.  She has no new complaints today.  She continues to feel better clinically.   She has steady weight, denies palpitations, tremors, heat/cold intolerance.  She reports normal appetite.  She denies diarrhea.  The patient does not know family history of thyroid dysfunction  since she is adopted. Review of systems: Limited as above  Objective:        02/28/2022    9:11 AM 10/27/2021    8:42 AM 07/21/2021    8:18 AM  Vitals with BMI  Height 5\' 3"  5\' 3"  5\' 3"   Weight 132 lbs 3 oz 130 lbs 10 oz 134 lbs 13 oz  BMI 23.42 25.85 27.78  Systolic 242 353 614  Diastolic 58 66 72  Pulse 96 60 72     Recent Results (from the past 2160 hour(s))  TSH     Status: Abnormal   Collection Time: 02/09/22  1:14 PM  Result Value Ref Range   TSH 0.314 (L) 0.450 - 4.500 uIU/mL  T4, free     Status: None   Collection Time: 02/09/22  1:14 PM  Result Value Ref Range   Free T4 1.03 0.82 - 1.77 ng/dL  T3, free     Status: None   Collection Time: 02/09/22  1:14 PM  Result Value Ref Range   T3, Free 3.2 2.0 - 4.4 pg/mL   Thyroid ultrasound on December 17, 2018: Right lobe measuring 5.500 with 0.9 cm partially cystic nodule, left lobe measuring 5.5 cm with questionable isoechoic nodule measuring 1.3 cm.  This was favored to be rather a pseudonodule as it lacks defined borders.   On October 20, 2021 Repeat thyroid ultrasound before this visit showed right lobe slightly bigger at 6.1 cm, left lobe 5.5 centimeters unchanged from previous study. She has  a nodule measuring 1 cm in the isthmus and another nodule measuring 1.4 cm and left lobe of the thyroid.  Nodules are described as benign.    Assessment & Plan:   1. Subclinical hyperthyroidism-resolved 2.  Multinodular goiter -Her thyroid function tests are consistent with mildly suppressed TSH but normal free T4 and tree T3. -  She is advised to stay off of methimazole until next measurement.  I discussed her thyroid ultrasound which has multiple nodules with benign descriptions.  She will not need biopsy or surgery at this time.    She will have pre--visit TFTs before next visit in 3 months.  She is advised to update her screening studies including Pap smear, mammogram, and colonoscopy.       - I advised patient to maintain close follow up with Joanie Coddington, MD for primary care needs.   I spent  21 minutes in the care of the patient today including review of labs from Thyroid Function, CMP, and other relevant labs ; imaging/biopsy records (current and previous including abstractions from other facilities); face-to-face time discussing  her lab results and symptoms, medications doses, her options of short and long term treatment based on the latest standards of care / guidelines;   and documenting the encounter.  Christa Mullarkey  participated in the discussions, expressed understanding, and voiced agreement with the above plans.  All questions were answered to her satisfaction. she is encouraged to contact clinic should she have any questions or concerns prior to her return visit.    Follow up plan: Follow-up in 3 months with repeat thyroid function test.  Glade Lloyd, MD Phone: [phone]  Fax: [phone]   04/22/2018, 8:38 AM

## 2022-08-22 LAB — LIPID PANEL
Chol/HDL Ratio: 4.8 ratio — ABNORMAL HIGH (ref 0.0–4.4)
Cholesterol, Total: 229 mg/dL — ABNORMAL HIGH (ref 100–199)
HDL: 48 mg/dL (ref >39)
LDL Chol Calc (NIH): 163 mg/dL — ABNORMAL HIGH (ref 0–99)
Triglycerides: 99 mg/dL (ref 0–149)
VLDL Cholesterol Cal: 18 mg/dL (ref 5–40)

## 2022-08-22 LAB — TSH: TSH: 0.14 u[IU]/mL — ABNORMAL LOW (ref 0.450–4.500)

## 2022-08-22 LAB — T4, FREE: Free T4: 1.04 ng/dL (ref 0.82–1.77)

## 2022-08-22 LAB — T3, FREE: T3, Free: 2.9 pg/mL (ref 2.0–4.4)

## 2022-08-31 ENCOUNTER — Ambulatory Visit: Payer: Self-pay | Admitting: "Endocrinology

## 2022-09-07 ENCOUNTER — Encounter: Payer: Self-pay | Admitting: "Endocrinology

## 2022-09-07 ENCOUNTER — Ambulatory Visit (INDEPENDENT_AMBULATORY_CARE_PROVIDER_SITE_OTHER): Payer: Self-pay | Admitting: "Endocrinology

## 2022-09-07 VITALS — BP 94/68 | HR 64 | Ht 63.0 in | Wt 133.8 lb

## 2022-09-07 DIAGNOSIS — E782 Mixed hyperlipidemia: Secondary | ICD-10-CM

## 2022-09-07 DIAGNOSIS — E0590 Thyrotoxicosis, unspecified without thyrotoxic crisis or storm: Secondary | ICD-10-CM

## 2022-09-07 DIAGNOSIS — E042 Nontoxic multinodular goiter: Secondary | ICD-10-CM

## 2022-09-07 MED ORDER — ROSUVASTATIN CALCIUM 5 MG PO TABS: 5.0000 mg | ORAL_TABLET | Freq: Every evening | ORAL | 1 refills | Status: DC

## 2022-09-07 NOTE — Progress Notes (Signed)
09/07/2022        Endocrinology follow-up note  Subjective:    Patient ID: Lorraine Gordon, female    DOB: [birth year], PCP [name], MD. Past Medical History:  Diagnosis Date   Hyperthyroidism     Past Surgical History:  Procedure Laterality Date   ABDOMINAL HYSTERECTOMY     BONE MARROW BIOPSY  11/2021   Social History   Socioeconomic History   Marital status: Single    Spouse name: Not on file   Number of children: Not on file   Years of education: Not on file   Highest education level: Not on file  Occupational History   Not on file  Tobacco Use   Smoking status: Former   Smokeless tobacco: Never  Vaping Use   Vaping status: Never Used  Substance and Sexual Activity   Alcohol use: No    Alcohol/week: 0.0 standard drinks of alcohol   Drug use: No   Sexual activity: Not on file  Other Topics Concern   Not on file  Social History Narrative   Not on file   Social Determinants of Health   Financial Resource Strain: Low Risk  (04/19/2020)   Received from [facility], [facility]   Overall Financial Resource Strain (CARDIA)    Difficulty of Paying Living Expenses: Not hard at all  Food Insecurity: No Food Insecurity (04/19/2020)   Received from [facility], [facility]   Hunger Vital Sign    Worried About Running Out of Food in the Last Year: Never true    Ran Out of Food in the Last Year: Never true  Transportation Needs: No Transportation Needs (04/19/2020)   Received from [facility], [facility]   [facility] - Transportation    Lack of Transportation (Medical): No    Lack of Transportation (Non-Medical): No  Physical Activity: Not on file  Stress: Not on file  Social Connections: Not on file  Intimate Partner Violence: Not on file     Outpatient Encounter Medications as of 09/07/2022  Medication Sig   rosuvastatin (CRESTOR) 5 MG tablet Take 1 tablet (5 mg total) by mouth at bedtime.   Acetaminophen (TYLENOL EXTRA STRENGTH PO)  Take by mouth as needed.   Loratadine (CLARITIN PO) Take 1 tablet by mouth daily.   Multiple Vitamin (MULTIVITAMIN ADULT PO) Take 1 tablet by mouth daily.   sodium chloride (OCEAN NASAL SPRAY) 0.65 % nasal spray Place 1 spray into the nose as needed for congestion.   TURMERIC PO Take 1 capsule by mouth daily in the afternoon.   No facility-administered encounter medications on file as of 09/07/2022.    ALLERGIES: Allergies  Allergen Reactions   Compazine [Prochlorperazine Edisylate]    Phenergan [Promethazine Hcl]    VACCINATION STATUS:  There is no immunization history on file for this patient.  HPI -62 year old female patient with medical history of subclinical hyperthyroidism, returns with repeat thyroid function test for follow-up.     After she was briefly treated with low-dose methimazole to which she has responded with mild iatrogenic hypothyroidism, she was taken off of it during her last visit.  She has no new complaints today.  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    She has steady weight, denies palpitations, tremors, heat/cold intolerance.  She reports normal appetite.  She denies diarrhea.  Her previsit labs show severe dyslipidemia.  The patient does not know family history of thyroid dysfunction  since she  is adopted. Review of systems: Limited as above  Objective:        09/07/2022    8:58 AM 02/28/2022    9:11 AM 10/27/2021    8:42 AM  Vitals with BMI  Height 5\' 3"  5\' 3"  5\' 3"   Weight 133 lbs 13 oz 132 lbs 3 oz 130 lbs 10 oz  BMI 23.71 23.42 23.14  Systolic 94 100 108  Diastolic 68 58 66  Pulse 64 96 60     Recent Results (from the past 2160 hour(s))  TSH     Status: Abnormal   Collection Time: 08/21/22  9:32 AM  Result Value Ref Range   TSH 0.140 (L) 0.450 - 4.500 uIU/mL  T4, free     Status: None   Collection Time: 08/21/22  9:32 AM  Result Value Ref Range   Free T4 1.04 0.82 - 1.77 ng/dL  T3, free     Status: None    Collection Time: 08/21/22  9:32 AM  Result Value Ref Range   T3, Free 2.9 2.0 - 4.4 pg/mL  Lipid panel     Status: Abnormal   Collection Time: 08/21/22  9:32 AM  Result Value Ref Range   Cholesterol, Total 229 (H) 100 - 199 mg/dL   Triglycerides 99 0 - 149 mg/dL   HDL 48 >84 mg/dL   VLDL Cholesterol Cal 18 5 - 40 mg/dL   LDL Chol Calc (NIH) 166 (H) 0 - 99 mg/dL   Chol/HDL Ratio 4.8 (H) 0.0 - 4.4 ratio    Comment:                                   T. Chol/HDL Ratio                                             Men  Women                               1/2 Avg.Risk  3.4    3.3                                   Avg.Risk  5.0    4.4                                2X Avg.Risk  9.6    7.1                                3X Avg.Risk 23.4   11.0    Thyroid ultrasound on December 17, 2018: Right lobe measuring 5.500 with 0.9 cm partially cystic nodule, left lobe measuring 5.5 cm with questionable isoechoic nodule measuring 1.3 cm.  This was favored to be rather a pseudonodule as it lacks defined borders.   On October 20, 2021 Repeat thyroid ultrasound before this visit showed right lobe slightly bigger at 6.1 cm, left lobe 5.5 centimeters unchanged from previous study. She has a nodule measuring 1 cm in the isthmus and another nodule measuring 1.4 cm and left lobe of the  thyroid.  Nodules are described as benign.    Assessment & Plan:   1. Subclinical hyperthyroidism-resolved 2.  Multinodular goiter 3.  Dyslipidemia -Her thyroid function tests are consistent with mildly suppressed TSH but normal free T4 and tree T3. -  She is advised to stay off of methimazole until next measurement.  I discussed her thyroid ultrasound which has multiple nodules with benign descriptions.  She will not need biopsy or surgery at this time.   In light of her severe dyslipidemia, she is approached for preventive medicine with early initiation of low-dose statin.  She is in agreement.  I discussed initiated  Crestor 5 mg p.o. nightly.  Side effects and precautions discussed with her. She will have pre--visit TFTs and repeat lipid panel before next visit in 3 months.  She is advised to update her screening studies including Pap smear, mammogram, and colonoscopy.       - I advised patient to maintain close follow up with [name], MD for primary care needs.   I spent  22  minutes in the care of the patient today including review of labs from Thyroid Function, CMP, and other relevant labs ; imaging/biopsy records (current and previous including abstractions from other facilities); face-to-face time discussing  her lab results and symptoms, medications doses, her options of short and long term treatment based on the latest standards of care / guidelines;   and documenting the encounter.  Dilara Fuerte  participated in the discussions, expressed understanding, and voiced agreement with the above plans.  All questions were answered to her satisfaction. she is encouraged to contact clinic should she have any questions or concerns prior to her return visit.   Follow up plan: Follow-up in 3 months with repeat thyroid function test.  Marquis Lunch, MD Phone: [phone]  Fax: [phone]   04/22/2018, 8:38 AM

## 2022-12-19 LAB — LIPID PANEL
Chol/HDL Ratio: 3.4 {ratio} (ref 0.0–4.4)
Cholesterol, Total: 161 mg/dL (ref 100–199)
HDL: 48 mg/dL (ref >39)
LDL Chol Calc (NIH): 100 mg/dL — ABNORMAL HIGH (ref 0–99)
Triglycerides: 64 mg/dL (ref 0–149)
VLDL Cholesterol Cal: 13 mg/dL (ref 5–40)

## 2022-12-19 LAB — TSH: TSH: 0.214 u[IU]/mL — ABNORMAL LOW (ref 0.450–4.500)

## 2022-12-19 LAB — T4, FREE: Free T4: 0.83 ng/dL (ref 0.82–1.77)

## 2022-12-19 LAB — T3, FREE: T3, Free: 3.1 pg/mL (ref 2.0–4.4)

## 2022-12-25 ENCOUNTER — Ambulatory Visit: Payer: Self-pay | Admitting: "Endocrinology

## 2023-01-01 ENCOUNTER — Telehealth: Payer: Self-pay | Admitting: "Endocrinology

## 2023-01-01 DIAGNOSIS — E0590 Thyrotoxicosis, unspecified without thyrotoxic crisis or storm: Secondary | ICD-10-CM

## 2023-01-01 NOTE — Telephone Encounter (Signed)
Sent pt my chart msg. Put in new lab order since next available follow up is next year.

## 2023-01-01 NOTE — Telephone Encounter (Signed)
Labs from 12/18/2022 Pt wants to know if there is anything she needs to do?  She cancelled her follow-up and has not rescheduled yet.   TSH 0.214 Low   Free T4 0.83  Free T3 3.1

## 2023-01-01 NOTE — Telephone Encounter (Signed)
Pt left a VM stating she has not heard back from anyone in reference to her lab work. She needs to know what to continue as far as her MG for her medicine. She is asking for a call back. Pt does not have an appt

## 2023-03-22 ENCOUNTER — Other Ambulatory Visit: Payer: Self-pay | Admitting: "Endocrinology

## 2023-03-25 ENCOUNTER — Telehealth: Payer: Self-pay | Admitting: "Endocrinology

## 2023-03-25 ENCOUNTER — Other Ambulatory Visit: Payer: Self-pay | Admitting: "Endocrinology

## 2023-03-25 DIAGNOSIS — E782 Mixed hyperlipidemia: Secondary | ICD-10-CM

## 2023-03-25 DIAGNOSIS — E0590 Thyrotoxicosis, unspecified without thyrotoxic crisis or storm: Secondary | ICD-10-CM

## 2023-03-25 NOTE — Telephone Encounter (Signed)
 Pt made aware and mailed order

## 2023-03-25 NOTE — Telephone Encounter (Signed)
 Pt called and she wants to know why her Crestor was sent in when in the note below Dr Fransico Him said she did not need to take it. She told the pharmacy to put it back on the shelf. Patient is asking for a call back

## 2023-03-25 NOTE — Telephone Encounter (Signed)
 I talked with the patient. She said that she had misunderstood and has not been taking the crestor. She states that she is going to start taking them tonight. She plans to have lab work in the upcoming week or so.

## 2023-03-25 NOTE — Telephone Encounter (Signed)
 Patient is asking if she needs more labs for March 18 appointment?

## 2023-04-10 LAB — LIPID PANEL
Chol/HDL Ratio: 3.4 ratio (ref 0.0–4.4)
Cholesterol, Total: 150 mg/dL (ref 100–199)
HDL: 44 mg/dL (ref >39)
LDL Chol Calc (NIH): 92 mg/dL (ref 0–99)
Triglycerides: 69 mg/dL (ref 0–149)
VLDL Cholesterol Cal: 14 mg/dL (ref 5–40)

## 2023-04-10 LAB — TSH: TSH: 0.186 u[IU]/mL — ABNORMAL LOW (ref 0.450–4.500)

## 2023-04-10 LAB — T4, FREE: Free T4: 0.88 ng/dL (ref 0.82–1.77)

## 2023-04-10 LAB — T3, FREE: T3, Free: 3 pg/mL (ref 2.0–4.4)

## 2023-04-16 ENCOUNTER — Ambulatory Visit (INDEPENDENT_AMBULATORY_CARE_PROVIDER_SITE_OTHER): Payer: Self-pay | Admitting: "Endocrinology

## 2023-04-16 ENCOUNTER — Encounter: Payer: Self-pay | Admitting: "Endocrinology

## 2023-04-16 VITALS — BP 110/68 | HR 56 | Ht 63.0 in | Wt 133.0 lb

## 2023-04-16 DIAGNOSIS — E042 Nontoxic multinodular goiter: Secondary | ICD-10-CM

## 2023-04-16 DIAGNOSIS — E782 Mixed hyperlipidemia: Secondary | ICD-10-CM

## 2023-04-16 DIAGNOSIS — E0590 Thyrotoxicosis, unspecified without thyrotoxic crisis or storm: Secondary | ICD-10-CM

## 2023-04-16 MED ORDER — ROSUVASTATIN CALCIUM 5 MG PO TABS: 5.0000 mg | ORAL_TABLET | Freq: Every day | ORAL | 3 refills | Status: AC

## 2023-04-16 NOTE — Progress Notes (Signed)
 04/16/2023        Endocrinology follow-up note  Subjective:    Patient ID: Lorraine Gordon, female    DOB: 01-Feb-1960, PCP [name], MD. Past Medical History:  Diagnosis Date   Hyperthyroidism     Past Surgical History:  Procedure Laterality Date   ABDOMINAL HYSTERECTOMY     BONE MARROW BIOPSY  11/2021   Social History   Socioeconomic History   Marital status: Single    Spouse name: Not on file   Number of children: Not on file   Years of education: Not on file   Highest education level: Not on file  Occupational History   Not on file  Tobacco Use   Smoking status: Former   Smokeless tobacco: Never  Vaping Use   Vaping status: Never Used  Substance and Sexual Activity   Alcohol use: No    Alcohol/week: 0.0 standard drinks of alcohol   Drug use: No   Sexual activity: Not on file  Other Topics Concern   Not on file  Social History Narrative   Not on file   Social Drivers of Health   Financial Resource Strain: Low Risk  (04/19/2020)   Received from [facility], [facility]   Overall Financial Resource Strain (CARDIA)    Difficulty of Paying Living Expenses: Not hard at all  Food Insecurity: No Food Insecurity (04/19/2020)   Received from [facility], [facility]   Hunger Vital Sign    Worried About Running Out of Food in the Last Year: Never true    Ran Out of Food in the Last Year: Never true  Transportation Needs: No Transportation Needs (04/19/2020)   Received from [facility], [facility]   [facility] - Transportation    Lack of Transportation (Medical): No    Lack of Transportation (Non-Medical): No  Physical Activity: Not on file  Stress: Not on file  Social Connections: Not on file  Intimate Partner Violence: Not on file     Outpatient Encounter Medications as of 04/16/2023  Medication Sig   Acetaminophen (TYLENOL EXTRA STRENGTH PO) Take by mouth as needed.   Loratadine (CLARITIN PO) Take 1 tablet by mouth daily.    Multiple Vitamin (MULTIVITAMIN ADULT PO) Take 1 tablet by mouth daily.   rosuvastatin (CRESTOR) 5 MG tablet Take 1 tablet (5 mg total) by mouth at bedtime.   sodium chloride (OCEAN NASAL SPRAY) 0.65 % nasal spray Place 1 spray into the nose as needed for congestion.   [DISCONTINUED] rosuvastatin (CRESTOR) 5 MG tablet Take 1 tablet (5 mg total) by mouth at bedtime.   [DISCONTINUED] TURMERIC PO Take 1 capsule by mouth daily in the afternoon.   No facility-administered encounter medications on file as of 04/16/2023.    ALLERGIES: Allergies  Allergen Reactions   Compazine [Prochlorperazine Edisylate]    Phenergan [Promethazine Hcl]    VACCINATION STATUS:  There is no immunization history on file for this patient.  HPI -63 year old female patient with medical history of subclinical hyperthyroidism, returns with repeat thyroid function test for follow-up.     After she was briefly treated with low-dose methimazole to which she has responded with mild iatrogenic hypothyroidism, she was taken off of it during previous visit.  Her thyroid function tests are still consistent with mild subclinical hyperthyroidism.  Secondary hypothyroidism is not ruled out.  She has no new complaints today.  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    She has  steady weight, denies palpitations, tremors, heat/cold intolerance.  She reports normal appetite.  She denies diarrhea.  Her previsit labs show improvement in her lipid panel, currently on low-dose Crestor.    The patient does not know family history of thyroid dysfunction  since she is adopted. Review of systems: Limited as above  Objective:        04/16/2023    8:46 AM 09/07/2022    8:58 AM 02/28/2022    9:11 AM  Vitals with BMI  Height 5\' 3"  5\' 3"  5\' 3"   Weight 133 lbs 133 lbs 13 oz 132 lbs 3 oz  BMI 23.57 23.71 23.42  Systolic 110 94 100  Diastolic 68 68 58  Pulse 56 64 96     Recent Results (from the past 2160 hours)  TSH      Status: Abnormal   Collection Time: 04/09/23  9:12 AM  Result Value Ref Range   TSH 0.186 (L) 0.450 - 4.500 uIU/mL  T4, free     Status: None   Collection Time: 04/09/23  9:12 AM  Result Value Ref Range   Free T4 0.88 0.82 - 1.77 ng/dL  T3, free     Status: None   Collection Time: 04/09/23  9:12 AM  Result Value Ref Range   T3, Free 3.0 2.0 - 4.4 pg/mL  Lipid panel     Status: None   Collection Time: 04/09/23  9:12 AM  Result Value Ref Range   Cholesterol, Total 150 100 - 199 mg/dL   Triglycerides 69 0 - 149 mg/dL   HDL 44 >16 mg/dL   VLDL Cholesterol Cal 14 5 - 40 mg/dL   LDL Chol Calc (NIH) 92 0 - 99 mg/dL   Chol/HDL Ratio 3.4 0.0 - 4.4 ratio    Comment:                                   T. Chol/HDL Ratio                                             Men  Women                               1/2 Avg.Risk  3.4    3.3                                   Avg.Risk  5.0    4.4                                2X Avg.Risk  9.6    7.1                                3X Avg.Risk 23.4   11.0    Thyroid ultrasound on December 17, 2018: Right lobe measuring 5.500 with 0.9 cm partially cystic nodule, left lobe measuring 5.5 cm with questionable isoechoic nodule measuring 1.3 cm.  This was favored to be rather a pseudonodule as it lacks defined borders.   On October 20, 2021 Repeat thyroid  ultrasound before this visit showed right lobe slightly bigger at 6.1 cm, left lobe 5.5 centimeters unchanged from previous study. She has a nodule measuring 1 cm in the isthmus and another nodule measuring 1.4 cm and left lobe of the thyroid.  Nodules are described as benign.    Assessment & Plan:   1. Subclinical hyperthyroidism-resolved 2.  Multinodular goiter 3.  Dyslipidemia -Her thyroid function tests are consistent with mildly suppressed TSH but normal free T4 and tree T3. -  She is advised to stay off of methimazole until subsequent measurement.   I discussed her thyroid ultrasound which has  multiple nodules with benign descriptions.  She will not need biopsy or surgery at this time.    She has responded to Crestor 5 mg p.o. nightly for dyslipidemia.  She is advised to continue.  Side effects and precautions discussed with her.    - I advised patient to maintain close follow up with [name], MD for primary care needs.   I spent  22  minutes in the care of the patient today including review of labs from Thyroid Function, CMP, and other relevant labs ; imaging/biopsy records (current and previous including abstractions from other facilities); face-to-face time discussing  her lab results and symptoms, medications doses, her options of short and long term treatment based on the latest standards of care / guidelines;   and documenting the encounter.  Caressa Chain  participated in the discussions, expressed understanding, and voiced agreement with the above plans.  All questions were answered to her satisfaction. she is encouraged to contact clinic should she have any questions or concerns prior to her return visit.    Follow up plan: Follow-up in 3 months with repeat thyroid function test.  Marquis Lunch, MD Phone: [phone]  Fax: [phone]   04/22/2018, 8:38 AM

## 2023-10-22 ENCOUNTER — Ambulatory Visit: Payer: Self-pay | Admitting: "Endocrinology
